# Patient Record
Sex: Female | Born: 1935 | Race: White | Hispanic: No
Health system: Northeastern US, Academic
[De-identification: ages and names within clinical notes are randomized; demographics above are authoritative.]

## PROBLEM LIST (undated history)

## (undated) DIAGNOSIS — K580 Irritable bowel syndrome with diarrhea: Secondary | ICD-10-CM

## (undated) DIAGNOSIS — M1990 Unspecified osteoarthritis, unspecified site: Secondary | ICD-10-CM

## (undated) DIAGNOSIS — I491 Atrial premature depolarization: Secondary | ICD-10-CM

## (undated) HISTORY — PX: BREAST SURGERY: SHX581

## (undated) HISTORY — PX: INCISION AND DRAINAGE: SHX5863

## (undated) HISTORY — PX: CATARACT EXTRACTION: SUR2

## (undated) HISTORY — DX: Irritable bowel syndrome with diarrhea: K58.0

## (undated) HISTORY — PX: BUNIONECTOMY: SHX129

## (undated) HISTORY — PX: OVARIAN CYST REMOVAL: SHX89

## (undated) HISTORY — PX: CARPAL TUNNEL RELEASE: SHX101

## (undated) HISTORY — PX: ABDOMINAL HYSTERECTOMY: SHX81

## (undated) HISTORY — PX: BLADDER REPAIR: SHX76

---

## 2005-12-12 ENCOUNTER — Ambulatory Visit: Payer: Self-pay | Admitting: Family Medicine

## 2006-01-02 ENCOUNTER — Ambulatory Visit: Payer: Self-pay | Admitting: Family Medicine

## 2006-02-20 ENCOUNTER — Ambulatory Visit: Payer: Self-pay | Admitting: Family Medicine

## 2006-05-09 ENCOUNTER — Ambulatory Visit: Payer: Self-pay | Admitting: Family Medicine

## 2006-05-17 ENCOUNTER — Ambulatory Visit: Payer: Self-pay | Admitting: Family Medicine

## 2006-07-25 DIAGNOSIS — M899 Disorder of bone, unspecified: Secondary | ICD-10-CM | POA: Insufficient documentation

## 2006-07-25 DIAGNOSIS — M949 Disorder of cartilage, unspecified: Secondary | ICD-10-CM

## 2006-07-25 DIAGNOSIS — R002 Palpitations: Secondary | ICD-10-CM | POA: Insufficient documentation

## 2006-07-25 DIAGNOSIS — F411 Generalized anxiety disorder: Secondary | ICD-10-CM | POA: Insufficient documentation

## 2006-07-25 DIAGNOSIS — Z8659 Personal history of other mental and behavioral disorders: Secondary | ICD-10-CM | POA: Insufficient documentation

## 2006-07-25 DIAGNOSIS — F339 Major depressive disorder, recurrent, unspecified: Secondary | ICD-10-CM | POA: Insufficient documentation

## 2006-08-10 ENCOUNTER — Ambulatory Visit: Payer: Self-pay | Admitting: Family Medicine

## 2006-10-03 ENCOUNTER — Ambulatory Visit: Payer: Self-pay | Admitting: Internal Medicine

## 2006-10-03 ENCOUNTER — Encounter: Payer: Self-pay | Admitting: Family Medicine

## 2006-10-11 ENCOUNTER — Encounter: Payer: Self-pay | Admitting: Family Medicine

## 2006-10-30 ENCOUNTER — Ambulatory Visit: Payer: Self-pay | Admitting: Internal Medicine

## 2006-10-30 ENCOUNTER — Ambulatory Visit: Payer: Self-pay

## 2006-10-30 ENCOUNTER — Encounter: Payer: Self-pay | Admitting: Internal Medicine

## 2006-10-30 ENCOUNTER — Encounter: Payer: Self-pay | Admitting: Family Medicine

## 2006-10-30 LAB — CONVERTED CEMR LAB
ALT: 20 units/L (ref 0–40)
Alkaline Phosphatase: 60 units/L (ref 39–117)
CO2: 32 meq/L (ref 19–32)
Chloride: 109 meq/L (ref 96–112)
Chol/HDL Ratio, serum: 2.9
Cholesterol: 225 mg/dL (ref 0–200)
Glucose, Bld: 89 mg/dL (ref 70–99)
Potassium: 4.5 meq/L (ref 3.5–5.1)
Sodium: 144 meq/L (ref 135–145)
Triglyceride fasting, serum: 105 mg/dL (ref 0–149)
VLDL: 21 mg/dL (ref 0–40)

## 2007-02-12 ENCOUNTER — Ambulatory Visit: Payer: Self-pay | Admitting: Family Medicine

## 2007-02-13 DIAGNOSIS — G5600 Carpal tunnel syndrome, unspecified upper limb: Secondary | ICD-10-CM | POA: Insufficient documentation

## 2007-02-19 ENCOUNTER — Telehealth: Payer: Self-pay | Admitting: Family Medicine

## 2007-05-18 ENCOUNTER — Ambulatory Visit: Payer: Self-pay

## 2007-05-30 ENCOUNTER — Ambulatory Visit: Payer: Self-pay | Admitting: Internal Medicine

## 2007-06-07 ENCOUNTER — Ambulatory Visit: Payer: Self-pay | Admitting: Internal Medicine

## 2007-06-07 LAB — CONVERTED CEMR LAB
Albumin: 3.9 g/dL (ref 3.5–5.2)
Bilirubin, Direct: 0.1 mg/dL (ref 0.0–0.3)
Chloride: 105 meq/L (ref 96–112)
Cholesterol: 213 mg/dL (ref 0–200)
Direct LDL: 111.5 mg/dL
GFR calc Af Amer: 79 mL/min
GFR calc non Af Amer: 65 mL/min
Glucose, Bld: 96 mg/dL (ref 70–99)
Potassium: 4.3 meq/L (ref 3.5–5.1)
Sodium: 139 meq/L (ref 135–145)
Total CHOL/HDL Ratio: 2.3
Triglycerides: 63 mg/dL (ref 0–149)
VLDL: 13 mg/dL (ref 0–40)

## 2008-03-12 ENCOUNTER — Ambulatory Visit: Payer: Self-pay | Admitting: Family Medicine

## 2008-03-12 ENCOUNTER — Encounter: Admission: RE | Admit: 2008-03-12 | Discharge: 2008-03-12 | Payer: Self-pay | Admitting: Family Medicine

## 2008-03-12 DIAGNOSIS — M25559 Pain in unspecified hip: Secondary | ICD-10-CM | POA: Insufficient documentation

## 2008-06-12 ENCOUNTER — Ambulatory Visit: Payer: Self-pay | Admitting: Family Medicine

## 2008-06-12 DIAGNOSIS — M545 Low back pain, unspecified: Secondary | ICD-10-CM | POA: Insufficient documentation

## 2008-06-26 ENCOUNTER — Encounter: Payer: Self-pay | Admitting: Family Medicine

## 2008-07-31 ENCOUNTER — Encounter: Payer: Self-pay | Admitting: Family Medicine

## 2008-09-04 ENCOUNTER — Encounter: Payer: Self-pay | Admitting: Family Medicine

## 2008-12-04 ENCOUNTER — Ambulatory Visit: Payer: Self-pay | Admitting: Internal Medicine

## 2008-12-04 ENCOUNTER — Encounter: Payer: Self-pay | Admitting: Internal Medicine

## 2008-12-04 DIAGNOSIS — R519 Headache, unspecified: Secondary | ICD-10-CM | POA: Insufficient documentation

## 2008-12-04 DIAGNOSIS — R51 Headache: Secondary | ICD-10-CM | POA: Insufficient documentation

## 2008-12-04 DIAGNOSIS — E785 Hyperlipidemia, unspecified: Secondary | ICD-10-CM | POA: Insufficient documentation

## 2009-03-19 ENCOUNTER — Ambulatory Visit: Payer: Self-pay | Admitting: Family Medicine

## 2009-03-19 DIAGNOSIS — H9190 Unspecified hearing loss, unspecified ear: Secondary | ICD-10-CM | POA: Insufficient documentation

## 2009-03-19 DIAGNOSIS — L255 Unspecified contact dermatitis due to plants, except food: Secondary | ICD-10-CM | POA: Insufficient documentation

## 2009-03-20 ENCOUNTER — Telehealth (INDEPENDENT_AMBULATORY_CARE_PROVIDER_SITE_OTHER): Payer: Self-pay | Admitting: *Deleted

## 2009-03-23 ENCOUNTER — Telehealth: Payer: Self-pay | Admitting: Family Medicine

## 2009-03-27 ENCOUNTER — Encounter: Payer: Self-pay | Admitting: Family Medicine

## 2009-04-08 ENCOUNTER — Encounter: Payer: Self-pay | Admitting: Family Medicine

## 2009-04-16 ENCOUNTER — Encounter: Payer: Self-pay | Admitting: Family Medicine

## 2009-05-13 ENCOUNTER — Encounter: Payer: Self-pay | Admitting: Family Medicine

## 2009-08-27 ENCOUNTER — Ambulatory Visit: Payer: Self-pay | Admitting: Family Medicine

## 2009-08-27 DIAGNOSIS — J309 Allergic rhinitis, unspecified: Secondary | ICD-10-CM | POA: Insufficient documentation

## 2009-08-28 ENCOUNTER — Telehealth: Payer: Self-pay | Admitting: Family Medicine

## 2009-09-17 ENCOUNTER — Encounter (INDEPENDENT_AMBULATORY_CARE_PROVIDER_SITE_OTHER): Payer: Self-pay | Admitting: *Deleted

## 2009-10-21 ENCOUNTER — Ambulatory Visit: Payer: Self-pay | Admitting: Family Medicine

## 2009-11-10 ENCOUNTER — Ambulatory Visit: Payer: Self-pay | Admitting: Family Medicine

## 2009-11-10 DIAGNOSIS — H6980 Other specified disorders of Eustachian tube, unspecified ear: Secondary | ICD-10-CM | POA: Insufficient documentation

## 2009-11-10 DIAGNOSIS — D239 Other benign neoplasm of skin, unspecified: Secondary | ICD-10-CM | POA: Insufficient documentation

## 2009-11-10 DIAGNOSIS — H6990 Unspecified Eustachian tube disorder, unspecified ear: Secondary | ICD-10-CM | POA: Insufficient documentation

## 2009-11-13 ENCOUNTER — Encounter: Payer: Self-pay | Admitting: Family Medicine

## 2009-11-25 ENCOUNTER — Ambulatory Visit: Payer: Self-pay | Admitting: Internal Medicine

## 2009-12-18 ENCOUNTER — Encounter: Payer: Self-pay | Admitting: Internal Medicine

## 2009-12-22 ENCOUNTER — Telehealth: Payer: Self-pay | Admitting: Internal Medicine

## 2009-12-23 LAB — CONVERTED CEMR LAB
Albumin: 4.2 g/dL (ref 3.5–5.2)
Alkaline Phosphatase: 67 units/L (ref 39–117)
BUN: 24 mg/dL — ABNORMAL HIGH (ref 6–23)
CO2: 27 meq/L (ref 19–32)
Chloride: 106 meq/L (ref 96–112)
Creatinine, Ser: 0.86 mg/dL (ref 0.40–1.20)
HCT: 34.7 % — ABNORMAL LOW (ref 36.0–46.0)
Hemoglobin: 11.1 g/dL — ABNORMAL LOW (ref 12.0–15.0)
Indirect Bilirubin: 0.3 mg/dL (ref 0.0–0.9)
LDL Cholesterol: 111 mg/dL — ABNORMAL HIGH (ref 0–99)
Potassium: 4.5 meq/L (ref 3.5–5.3)
RBC: 3.19 M/uL — ABNORMAL LOW (ref 3.87–5.11)
Total Bilirubin: 0.4 mg/dL (ref 0.3–1.2)
Total Protein: 6.8 g/dL (ref 6.0–8.3)
Triglycerides: 89 mg/dL (ref <150)
VLDL: 18 mg/dL (ref 0–40)
WBC: 3.6 10*3/uL — ABNORMAL LOW (ref 4.0–10.5)

## 2009-12-25 ENCOUNTER — Ambulatory Visit: Payer: Self-pay | Admitting: Family Medicine

## 2009-12-25 DIAGNOSIS — D61818 Other pancytopenia: Secondary | ICD-10-CM | POA: Insufficient documentation

## 2009-12-28 ENCOUNTER — Encounter: Payer: Self-pay | Admitting: Family Medicine

## 2009-12-29 LAB — CONVERTED CEMR LAB
Hemoglobin: 11.6 g/dL — ABNORMAL LOW (ref 12.0–15.0)
Lymphs Abs: 2.5 10*3/uL (ref 0.7–4.0)
MCHC: 33.2 g/dL (ref 30.0–36.0)
MCV: 105.4 fL — ABNORMAL HIGH (ref 78.0–100.0)
Monocytes Absolute: 0.3 10*3/uL (ref 0.1–1.0)
Monocytes Relative: 9 % (ref 3–12)
Neutro Abs: 0.9 10*3/uL — ABNORMAL LOW (ref 1.7–7.7)
Neutrophils Relative %: 24 % — ABNORMAL LOW (ref 43–77)
RBC: 3.31 M/uL — ABNORMAL LOW (ref 3.87–5.11)
WBC: 3.9 10*3/uL — ABNORMAL LOW (ref 4.0–10.5)

## 2009-12-30 ENCOUNTER — Encounter: Payer: Self-pay | Admitting: Family Medicine

## 2010-11-05 ENCOUNTER — Ambulatory Visit: Admit: 2010-11-05 | Payer: Self-pay | Admitting: Internal Medicine

## 2010-11-16 NOTE — Letter (Signed)
Summary: [facility]  [facility]   Imported By: Lanelle Bal 12/02/2009 09:29:57  _____________________________________________________________________  External Attachment:    Type:   Image     Comment:   External Document

## 2010-11-16 NOTE — Assessment & Plan Note (Signed)
Summary: Dog bite x last night rm 1   Vital Signs:  Patient Profile:   77 Years Old Female CC:      Dog Bite x last night Height:     63 inches Weight:      118 pounds O2 Sat:      100 % O2 treatment:    Room Air Temp:     97.2 degrees F oral Pulse rate:   65 / minute Pulse rhythm:   regular Resp:     16 per minute BP sitting:   129 / 60  (right arm) Cuff size:   regular  Vitals Entered By: Areta Haber CMA (October 21, 2009 12:38 PM)                  Prior Medication List:  ASPIR-LOW 81 MG TBEC (ASPIRIN) 1 tab by mouth daily VAGIFEM 25 MCG TABS (ESTRADIOL) 2 x awk * TUMS  ZITHROMAX Z-PAK 250 MG TABS (AZITHROMYCIN) Take as directed.   Current Allergies (reviewed today): ! PCN ! NEO-SYNEPHRINE (PHENYLEPHRINE HCL (PRESSORS))     History of Present Illness Chief Complaint: Dog Bite x last night History of Present Illness: Patient reports being bitten by her pet dog last night. she was trying to get a paper bag w/chocolate. The animal has had its shots and was a puppy. Unsure of last tetnus status.  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 ZITHROMAX Z-PAK 250 MG  TABS (AZITHROMYCIN) Use as directed BACTROBAN 2 % OINT (MUPIROCIN) apply to the wound 3x aday for 7-10 days  REVIEW OF SYSTEMS Constitutional Symptoms      Denies fever, chills, night sweats, weight loss, weight gain,  and fatigue.  Eyes       Denies change in vision, eye pain, eye discharge, glasses, contact lenses, and eye surgery. Ear/Nose/Throat/Mouth       Denies hearing loss/aids, change in hearing, ear pain, ear discharge, dizziness, frequent runny nose, frequent nose bleeds, sinus problems, sore throat, hoarseness, and tooth pain or bleeding.  Respiratory       Denies dry cough, productive cough, wheezing, shortness of breath, asthma, bronchitis, and emphysema/COPD.  Cardiovascular       Denies murmurs, chest pain, and tires easily with exhertion.    Gastrointestinal       Denies stomach pain, nausea/vomiting, diarrhea, constipation, blood in bowel movements, and indigestion. Genitourniary       Denies painful urination, kidney stones, and loss of urinary control. Neurological       Denies paralysis, seizures, and fainting/blackouts. Musculoskeletal       Denies muscle pain, joint pain, joint stiffness, decreased range of motion, redness, swelling, muscle weakness, and gout.  Skin       Denies bruising, unusual mles/lumps or sores, and hair/skin or nail changes.  Psych       Denies mood changes, temper/anger issues, anxiety/stress, speech problems, depression, and sleep problems. Other Comments: Pt state her dog bit her last night on R ring finger.   Past History:  Past Medical History: Last updated: 12/04/2008 1)  hx of agoraphobia and panic attacks 2) PACs-- caffeine - induced (Dr Gala Romney-- f/u stress test and echo)     --Echo 1/08 EF 65% trivial AI 3) Borderline hyperlipidemia 4) GYN- Dr Theophilus Bones (mammo and DXA ??)  Past Surgical History: Last updated: 10/11/2006 bladder surgery  Hysterectomy & BSO ovarian cyst removed  Family History: Last updated: 12/04/2008 No FHX CAD mother is alive in her 60s Father died in his 62s due to lung cancer  brother is alive and well in 64s  Social History: Last updated: 07/25/2006 Retired psychiatric NP.  Quit smoking in 1955.  Widowed x  1, married now to Cletis Athens- who is working in [place].  2 kids.  Son in [place].  Cares for her 2 yo grandson-- from her husband's family.  Exercises regularly.  Family History: Reviewed history from 12/04/2008 and no changes required. No FHX CAD mother is alive in her 33s Father died in his 31s due to lung cancer  brother is alive and well in 67s  Social History: Reviewed history from 07/25/2006 and no changes required. Retired psychiatric NP.  Quit smoking in 1955.  Widowed x 1, married now to Cletis Athens- who is working in [place].  2 kids.  Son in [place].  Cares for her 2 yo grandson-- from her husband's family.  Exercises regularly. Physical Exam General appearance: well developed, well nourished, mild distress Head: normocephalic, atraumatic Extremities: bite on R ring fingerw/  Neurological: grossly intact and non-focal Skin: no obvious rashes or lesions MSE: oriented to time, place, and person Assessment New Problems: DOG BITE (ICD-E906.0)  dog bite  Plan New Medications/Changes: BACTROBAN 2 % OINT (MUPIROCIN) apply to the wound 3x aday for 7-10 days  #1 tube x 0, 10/21/2009, Hassan Rowan MD ZITHROMAX Z-PAK 250 MG  TABS (AZITHROMYCIN) Use as directed  #1 x 0, 10/21/2009, Hassan Rowan MD  New Orders: New Patient Level III [99203] TD Toxoids IM 7 YR + [90714] Admin 1st Vaccine [90471] Admin 1st Vaccine [facility]) [phone] Planning Comments:   will give tetnus  Follow Up: Follow up on an as needed basis, Follow up with Primary Physician  The patient and/or caregiver has been counseled thoroughly with regard to medications prescribed including dosage, schedule, interactions, rationale for use, and possible side effects and they verbalize understanding.  Diagnoses and expected course of recovery discussed and will return if not improved as expected or if the condition worsens. Patient and/or caregiver verbalized understanding.  Prescriptions: BACTROBAN 2 % OINT (MUPIROCIN) apply to the  wound 3x aday for 7-10 days  #1 tube x 0   Entered and Authorized by:   Hassan Rowan MD   Signed by:   Hassan Rowan MD on 10/21/2009   Method used:   Printed then faxed to ...       [facility]  [place] [phone][facility] ([facility]       [address] [place], [place]  [address]       Ph: [phone] or [phone]       Fax: [phone]   RxID:   [id number] ZITHROMAX Z-PAK 250 MG  TABS (AZITHROMYCIN) Use as directed  #1 x 0   Entered and Authorized by:   Hassan Rowan MD   Signed by:   Hassan Rowan MD on 10/21/2009   Method used:   Printed then faxed to ...       [facility]  [address] [phone][facility] (retail)       [phone]  [address] [place], [place]  [address]       Ph: [phone] or [phone]       Fax: [phone]   RxID:   [id number]   Patient Instructions: 1)  may soak finger in epsom salt solution 2)  Please schedule a follow-up appointment as needed. 3)  Please schedule an appointment with your primary doctor in : 4)  Take your antibiotic as prescribed until ALL of it is gone, but stop if you develop a rash or swelling and contact our office as soon as possible.   Pending Immunizations Td Booster  Immunizations given and recorded during this visit TD BOOSTER Td  UVO:[id number] Pasteur  LotNo:[id number]  ExpDt:11/25/2010  IM  right deltoid  by Areta Haber CMA  VIS:09/04/07 version given October 21, 2009.      Tetanus/Td Vaccine    Vaccine Type: Td    Site: right deltoid    Mfr: Sanofi Pasteur    Dose: 0.5 ml    Route: IM    Given by: Areta Haber CMA    Exp. Date: 11/25/2010    Lot #: [id number]    VIS given: 09/04/07 version given October 21, 2009.

## 2010-11-16 NOTE — Miscellaneous (Signed)
Summary: TETANUS INFO  TETANUS INFO   Imported By: Junius Finner 10/21/2009 17:00:00  _____________________________________________________________________  External Attachment:    Type:   Image     Comment:   External Document

## 2010-11-16 NOTE — Progress Notes (Signed)
Summary: Lab results  Phone Note Call from Patient Call back at [phone]   Caller: Patient Summary of Call: Pt calling for lab results Initial call taken by: Judie Grieve,  December 22, 2009 8:38 AM  Follow-up for Phone Call        Pt called Dr. Ovidio Kin office inquiring about labs. Pt upset about not knowing lab results. I called cards and spoke w/ Herbert Seta- she stated that our office scheduled OV before she had a chance to call Pt w/ results. I explained to Pt and she will await Heather's call. Pt much more calm before hanging up.  Follow-up by: Payton Spark CMA,  December 23, 2009 2:17 PM  Additional Follow-up for Phone Call Additional follow up Details #1::        pt is aware of lab results, copy mailed per pt request Additional Follow-up by: Meredith Staggers, RN,  December 23, 2009 2:34 PM

## 2010-11-16 NOTE — Assessment & Plan Note (Signed)
Summary: Z6X   Primary Provider:  Seymour Zavala D.O.   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 she has a history of palpitations due to PACs which are particularly caffeine sensitive. She also has a history of borderline hyperlipidemia but has a very high HDL.  She returns today for yearly followup. He is doing great. She is exercising frequently at the gym on the treadmill and elliptical trainer. Also doing weights. She denies any chest pain or shortness of breath. Very rare brief palpitations. Worse with stress. Avoiding caffeine. She has not had any syncope or presyncope.    Current Medications (verified): 1)  Aspir-Low 81 Mg Tbec (Aspirin) .Marland Kitchen.. 1 Tab By Mouth Daily 2)  Vagifem 25 Mcg Tabs (Estradiol) .... 2 X Awk 3)  Tums 4)  Lutein 6 Mg Caps (Lutein) .... Take 1 Capsule By Mouth Two Times A Day 5)  Prednisone 5 Mg/77ml Soln (Prednisone) .... Two Times A Day  Allergies (verified): 1)  ! Pcn 2)  ! Neo-Synephrine (Phenylephrine Hcl (Pressors))  Past History:  Past Medical History: Last updated: 12/04/2008 1) hx of agoraphobia and panic attacks 2) PACs-- caffeine - induced (Kristin Zavala-- f/u stress test and echo)     --Echo 1/08 EF 65% trivial AI 3) Borderline hyperlipidemia 4) GYN- Kristin Zavala (mammo and DXA ??)  Review of Systems       As per HPI and past medical history; otherwise all systems negative.   Vital Signs:  Patient profile:   77 year old female Weight:      118 pounds Pulse rate:   66 / minute BP sitting:   120 / 68  Vitals Entered By: Kristin Staggers, RN (November 25, 2009 9:51 AM)  Physical Exam  General:  WRU:EAVW. well appearing. no resp difficulty.  HEENT: normal Neck: supple. no JVD. Carotids 2+ bilat; no bruits. No lymphadenopathy or thryomegaly appreciated. Cor: PMI nondisplaced. Regular rate & rhythm. No rubs, gallops, murmur. Lungs: clear Abdomen: soft, nontender,  nondistended.  Good bowel sounds. Extremities: no cyanosis, clubbing, rash, edema Neuro: alert & orientedx3, cranial nerves grossly intact. moves all 4 extremities w/o difficulty. affect pleasant    Impression & Recommendations:  Problem # 1:  PALPITATIONS (ICD-785.1) PACs well controlled. No further work-up at this time.   Problem # 2:  HYPERLIPIDEMIA-MIXED (ICD-272.4) Recheck lipids today and will forward to Kristin. Kristin Zavala as well.  Problem # 3:  HEALTH SCREENING (ICD-V70.0) Check labs including CBC, CMET, Lipids, Vitamin D.   Other Orders: EKG w/ Interpretation (93000)  Patient Instructions: 1)  Labs at [facility] 2)  Follow up in 1 year

## 2010-11-16 NOTE — Assessment & Plan Note (Signed)
Summary: eustachian tube dysfunction   Vital Signs:  Patient profile:   77 year old female Height:      63 inches Weight:      118 pounds BMI:     20.98 O2 Sat:      100 % on Room air Temp:     97.9 degrees F oral Pulse rate:   61 / minute BP sitting:   97 / 53  (left arm) Cuff size:   regular  Vitals Entered By: Payton Spark CMA (November 10, 2009 11:28 AM)  O2 Flow:  Room air CC: R ear ache. Feels fluid in ear.    Primary Care Provider:  Seymour Bars D.O.  CC:  R ear ache. Feels fluid in ear. Marland Kitchen  History of Present Illness: 3 days of crackling sensation in the R ear.  No fluid running out of the ear.  No dizziness, hearing loss, fevers or chills.  She is a bit congested. No stabbing pain.  She is not using anything RX or OTC for her R ear.    Current Medications (verified): 1)  Aspir-Low 81 Mg Tbec (Aspirin) .Marland Kitchen.. 1 Tab By Mouth Daily 2)  Vagifem 25 Mcg Tabs (Estradiol) .... 2 X Awk 3)  Tums  Allergies (verified): 1)  ! Pcn 2)  ! Neo-Synephrine (Phenylephrine Hcl (Pressors))  Past History:  Past Medical History: Reviewed history from 12/04/2008 and no changes required. 1) hx of agoraphobia and panic attacks 2) PACs-- caffeine - induced (Dr Gala Romney-- f/u stress test and echo)     --Echo 1/08 EF 65% trivial AI 3) Borderline hyperlipidemia 4) GYN- Dr Theophilus Bones (mammo and DXA ??)  Past Surgical History: bladder surgery  Hysterectomy & BSO ovarian cyst removed L cataract surgery 10-2009  Social History: Reviewed history from 07/25/2006 and no changes required. Retired psychiatric NP.  Quit smoking in 1955.  Widowed x 1, married now to Cletis Athens- who is working in [place].  2 kids.  Son in [place].  Cares for her 2 yo grandson-- from her husband's family.  Exercises regularly.  Review of Systems      See HPI  Physical Exam  General:  alert, well-developed, well-nourished, and well-hydrated.   Head:  normocephalic and atraumatic.   Eyes:  conjunctiva clear Ears:   EACs patent; TMs translucent and gray with good cone of light and bony landmarks.  Nose:  scant clear rhinorrhea Mouth:  throat midly injected Neck:  no masses.   Lungs:  Normal respiratory effort, chest expands symmetrically. Lungs are clear to auscultation, no crackles or wheezes. Heart:  Normal rate and regular rhythm. S1 and S2 normal without gallop, murmur, click, rub or other extra sounds. Skin:  color normal.  scaley pink lesion over dorsum of left hand Cervical Nodes:  No lymphadenopathy noted   Impression & Recommendations:  Problem # 1:  DYSFUNCTION OF EUSTACHIAN TUBE (ICD-381.81) Likely cause of her R ear symptoms with a normal ear exam. Treat with Claritin once daily for the next wk.  Problem # 2:  NEVUS, ATYPICAL (ICD-216.9) Will get her back in with Dr Arelia Longest to eval the pink scaley lesion on dorsum of L hand, present for months. Orders: Dermatology Referral (Derma)  Complete Medication List: 1)  Aspir-low 81 Mg Tbec (Aspirin) .Marland Kitchen.. 1 tab by mouth daily 2)  Vagifem 25 Mcg Tabs (Estradiol) .... 2 x awk 3)  Tums   Patient Instructions: 1)  Will get you in with Dr Arelia Longest for lesion on hand. 2)  Use  OTC Claritin once daily for eustachian tube dysfunction. 3)  Labs due -- seeing Dr Milas Kocher.

## 2010-11-16 NOTE — Assessment & Plan Note (Signed)
Summary: pancytopenia   Vital Signs:  Patient profile:   77 year old female Height:      63 inches Weight:      112 pounds BMI:     19.91 O2 Sat:      100 % on Room air Temp:     97.5 degrees F oral Pulse rate:   79 / minute BP sitting:   112 / 69  (left arm) Cuff size:   regular  Vitals Entered By: Payton Spark CMA (December 25, 2009 10:10 AM)  O2 Flow:  Room air CC: F/U labs   Primary Care Provider:  Seymour Bars D.O.  CC:  F/U labs.  History of Present Illness: 77 yo WF presents for f/u labs drawn by cardiology.  She is currently having diarrhea x 48 hrs.  She has lost 6 lbs but has not been trying to.  She has not had any hot flashes or nightsweats.  Her energy level has been fine.  She has not been on any immunosuppressants other than prednisone eye drops that she used between Jan and Feb.      Allergies: 1)  ! Pcn 2)  ! Neo-Synephrine (Phenylephrine Hcl (Pressors))  Review of Systems General:  Complains of loss of appetite, malaise, and weight loss; denies chills, fatigue, fever, sweats, and weakness. ENT:  Denies sore throat. CV:  Denies chest pain or discomfort and swelling of feet. Resp:  Denies cough and shortness of breath. GI:  Complains of diarrhea; denies abdominal pain, nausea, and vomiting. MS:  denies bone pain.  Physical Exam  General:  alert, well-developed, well-nourished, and well-hydrated.  thin appearing, slightly pale.  here with husband Head:  normocephalic and atraumatic.   Eyes:  sclera non icteric Mouth:  good dentition and pharynx pink and moist.   Neck:  no masses.   Lungs:  Normal respiratory effort, chest expands symmetrically. Lungs are clear to auscultation, no crackles or wheezes. Heart:  Normal rate and regular rhythm. S1 and S2 normal without gallop, murmur, click, rub or other extra sounds. Abdomen:  Bowel sounds positive,abdomen soft and non-tender without masses, organomegaly Extremities:  no LE edema Skin:  no jaundice,  slightly pale no bruising Cervical Nodes:  No lymphadenopathy noted Psych:  good eye contact, not anxious appearing, and not depressed appearing.      Impression & Recommendations:  Problem # 1:  PANCYTOPENIA (ICD-284.1) Assessment New Reviewed CBC from cardiology which shows pancytopenia, mild.  Exam findings normal other than some pallor and weight loss but she's also had 48 hrs of diarrhea. Will repeat CBC with diff and a peripheral smear on Monday (after GI bug resolves).  F/U results.  If still abnormal, will refer to Dr Mathis Bud to look for a myelosuppressive malignancy. Orders: T-CBC w/Diff ([id number]) T-Pathologist Smear Review (10080)  Complete Medication List: 1)  Aspir-low 81 Mg Tbec (Aspirin) .Marland Kitchen.. 1 tab by mouth daily 2)  Vagifem 25 Mcg Tabs (Estradiol) .... 2 x awk 3)  Tums  4)  Lutein 6 Mg Caps (Lutein) .... Take 1 capsule by mouth two times a day 5)  Prednisone 5 Mg/8ml Soln (Prednisone) .... Two times a day 6)  Vitamin D 50,000 Iu Capsules  .Marland Kitchen.. 1 capsule by mouth once a wk  Patient Instructions: 1)  Have labs drawn downstairs on Monday. 2)  Will call you w/ results on Tuesday. 3)  Schedule with Dr Abbe Amsterdam if still abnormal. 4)  Hydrate and rest this weekend. 5)  Use Immodium AD  as needed for diarrhea. Prescriptions: VITAMIN D 50,000 IU CAPSULES 1 capsule by mouth once a wk  #4 x 3   Entered and Authorized by:   Seymour Bars DO   Signed by:   Seymour Bars DO on 12/25/2009   Method used:   Printed then faxed to ...       [facility]  [place] [phone]* (retail)       [address] [place], [place]  [address]       Ph: [phone] or [phone]       Fax: [phone]   RxID:   [id number]

## 2010-12-08 ENCOUNTER — Encounter: Payer: Self-pay | Admitting: Internal Medicine

## 2010-12-08 ENCOUNTER — Ambulatory Visit (INDEPENDENT_AMBULATORY_CARE_PROVIDER_SITE_OTHER): Payer: Medicare Other | Admitting: Internal Medicine

## 2010-12-08 DIAGNOSIS — I4949 Other premature depolarization: Secondary | ICD-10-CM

## 2010-12-08 DIAGNOSIS — E785 Hyperlipidemia, unspecified: Secondary | ICD-10-CM

## 2010-12-09 ENCOUNTER — Ambulatory Visit: Payer: Self-pay | Admitting: Internal Medicine

## 2010-12-13 ENCOUNTER — Ambulatory Visit: Payer: Self-pay | Admitting: Internal Medicine

## 2010-12-14 NOTE — Assessment & Plan Note (Signed)
Summary: rov/chest pain/car PER PT CALL-[facility]J PT RS APPT/MT CONFIRMED APP...   Visit Type:  Follow-up Primary Provider:  Seymour Zavala D.O.  CC:  no cardiac complaints.  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 she has a history of palpitations due to PACs which are particularly caffeine sensitive. She also has a history of borderline hyperlipidemia but has a very high HDL.  She returns today for yearly followup. He is doing great. She is exercising frequently at the gym on the treadmill and elliptical trainer. Also doing weights. Had some CP a while back while on Actonel and started on nexium and symptoms resolved. Now off Nexium with no recurrence. Still with occasional PACs. Worse with stress. Avoiding caffeine. She has not had any syncope or presyncope.  Seeing Kristin. Kristin Zavala at [facility] for mild pancytopenia. BM bx unrevealing. No bleeding.   Current Medications (verified): 1)  Aspir-Low 81 Mg Tbec (Aspirin) .Marland Kitchen.. 1 Tab By Mouth Daily 2)  Vagifem 10 Mcg Tabs (Estradiol) .... Take One Twice A Week 3)  Lutein 6 Mg Caps (Lutein) .... Take 1 Capsule By Mouth Two Times A Day 4)  Prednisone 5 Mg/76ml Soln (Prednisone) .... Two Times A Day 5)  Vitamin D-1000 Max St 1000 Unit Tabs (Cholecalciferol) .... Take One Daily 6)  Calcium 500 Mg Tabs (Calcium) .... Take One Daily 7)  Centrum Silver Ultra Womens  Tabs (Multiple Vitamins-Minerals) .... Take One Daily 8)  Valtrex 500 Mg Tabs (Valacyclovir Hcl) .... Take One Twice For Three Days Prn  Allergies (verified): 1)  ! Pcn 2)  ! Neo-Synephrine (Phenylephrine Hcl (Pressors))  Past History:  Past Medical History: 1) hx of agoraphobia and panic attacks 2) PACs-- caffeine - induced (Kristin Zavala-- f/u stress test and echo)     --Echo 1/08 EF 65% trivial AI 3) Borderline hyperlipidemia 4) GYN- Kristin Zavala (mammo and DXA ??) 5) Pancytopenia - followed by Kristin. Kristin Zavala @ [facility]  Family  History: Reviewed history from 12/04/2008 and no changes required. No FHX CAD mother is alive in her 78s Father died in his 22s due to lung cancer  brother is alive and well in 65s  Social History: Reviewed history from 07/25/2006 and no changes required. Retired psychiatric NP.  Quit smoking in 1955.  Widowed x 1, married now to Kristin Zavala- who is working in [place].  2 kids.  Son in [place].  Cares for her 2 yo grandson-- from her husband's family.  Exercises regularly.  Review of Systems       As per HPI and past medical history; otherwise all systems negative.   Vital Signs:  Patient profile:   77 year old female Height:      63 inches Weight:      118 pounds BMI:     20.98 Pulse rate:   68 / minute Pulse rhythm:   regular BP sitting:   102 / 62  (right arm)  Vitals Entered By: Kristin Zavala, Kristin Zavala (December 08, 2010 11:23 AM)  Physical Exam  General:  thin. well appearing. no resp difficulty.  HEENT: normal Neck: supple. no JVD. Carotids 2+ bilat; no bruits. No lymphadenopathy or thryomegaly appreciated. Cor: PMI nondisplaced. Regular rate & rhythm. No rubs, gallops, murmur. Lungs: clear Abdomen: soft, nontender, nondistended.  Good bowel sounds. Extremities: no cyanosis, clubbing, rash, edema Neuro: alert & orientedx3, cranial nerves grossly intact. moves all 4 extremities w/o difficulty. affect pleasant   Impression & Recommendations:  Problem # 1:  PALPITATIONS (ICD-785.1) PACs well controlled. No further work-up at this time. Can stop ASA, particularly given pancytopenia.  Problem # 2:  HYPERLIPIDEMIA-MIXED (ICD-272.4) Due for repeat lipid check will also check CRP at her request.  Problem # 3:  CHEST PAIN I agree with Kristin Zavala - suspect GERD in setting of Actonel therapy. Now resolved. No exertional symptoms. No further work-up at this time.   Other Orders: EKG w/ Interpretation (93000)  Patient Instructions: 1)  Stop Aspirin 2)  Labs at [facility], we have  given you a prescription 3)  Your physician wants you to follow-up in:  1 year.  You will receive a reminder letter in the mail two months in advance. If you don't receive a letter, please call our office to schedule the follow-up appointment.

## 2010-12-17 ENCOUNTER — Encounter: Payer: Self-pay | Admitting: Internal Medicine

## 2010-12-20 LAB — CONVERTED CEMR LAB
HCT: 35.7 % — ABNORMAL LOW (ref 36.0–46.0)
Hemoglobin: 11.6 g/dL — ABNORMAL LOW (ref 12.0–15.0)
MCV: 105 fL — ABNORMAL HIGH (ref 78.0–100.0)
WBC: 4.1 10*3/uL (ref 4.0–10.5)

## 2010-12-21 ENCOUNTER — Encounter: Payer: Self-pay | Admitting: Internal Medicine

## 2010-12-22 ENCOUNTER — Telehealth: Payer: Self-pay | Admitting: Internal Medicine

## 2010-12-23 ENCOUNTER — Telehealth: Payer: Self-pay | Admitting: Internal Medicine

## 2010-12-28 LAB — CONVERTED CEMR LAB
Albumin: 4.3 g/dL (ref 3.5–5.2)
Alkaline Phosphatase: 49 units/L (ref 39–117)
BUN: 30 mg/dL — ABNORMAL HIGH (ref 6–23)
Bilirubin, Direct: 0.1 mg/dL (ref 0.0–0.3)
CO2: 28 meq/L (ref 19–32)
Calcium: 9 mg/dL (ref 8.4–10.5)
Chloride: 106 meq/L (ref 96–112)
Creatinine, Ser: 0.94 mg/dL (ref 0.40–1.20)
Glucose, Bld: 84 mg/dL (ref 70–99)
Indirect Bilirubin: 0.4 mg/dL (ref 0.0–0.9)
LDL Cholesterol: 113 mg/dL — ABNORMAL HIGH (ref 0–99)
Total Bilirubin: 0.5 mg/dL (ref 0.3–1.2)

## 2010-12-28 NOTE — Progress Notes (Signed)
Summary: Pt returning call  Phone Note Call from Patient Call back at [phone]   Caller: Patient Summary of Call: Pt returning call Initial call taken by: Judie Grieve,  December 23, 2010 10:33 AM  Follow-up for Phone Call        Pt. wanted to know her fasting bl. work. Dr. Gala Romney hasn't reviewed yet. Copy sent in mail per patient's request. Ellender Hose RN  December 23, 2010 10:41 AM  Follow-up by: Whitney Maeola Sarah RN,  December 23, 2010 10:41 AM

## 2010-12-28 NOTE — Progress Notes (Signed)
Summary: pt returning call  Phone Note Call from Patient Call back at [phone]   Caller: Patient Summary of Call: Pt returning call Initial call taken by: Judie Grieve,  December 22, 2010 1:43 PM  Follow-up for Phone Call        LVM that we would call her once blood work has been signed & reviewed. Whitney Maeola Sarah RN  December 22, 2010 3:22 PM  Follow-up by: Whitney Maeola Sarah RN,  December 22, 2010 3:22 PM

## 2011-03-01 NOTE — Assessment & Plan Note (Signed)
[facility]                            CARDIOLOGY OFFICE NOTE   LAILA, MYHRE                     MRN:          [id number]  DATE:05/30/2007                            DOB:          05/24/1931    PRIMARY CARE PHYSICIAN:  Seymour Bars, D.O.   PATIENT IDENTIFICATION:  Ms. Dragos is a delightful 77 year old woman  who is marred to Desmond Lope and presents for routine followup.  She has  a history of PACs and borderline hyperlipidemia with a high HDL.  She is  doing quite well.  She is active.  She goes to the gym 4 times a week  without any chest pain or shortness of breath.  She did have a stress  test recently which showed good exercise tolerance and no ST-T wave  changes with exercise.  She does continue to feel occasional extra beats  but no sustained palpitations.   She has been hesitant about starting low dose aspirin due to risk of  bruising.   CURRENT MEDICATIONS:  1. Lexapro 5 mg a day.  2. Ocuvite 1 tablet a day.  3. Tums 2 tablets a day.  4. Aspirin pending.  5. Erythromycin eye drops.   PHYSICAL EXAMINATION:  GENERAL:  She is well-appearing in no acute  distress. She ambulates around the clinic without any respiratory  difficulty.  VITAL SIGNS:  Blood pressure is initially 120/60, manual  recheck 135/60, heart rate is 63, weight is 122.  HEENT:  Normal.  NECK:  Supple.  There is no JVD.  Carotids are 2 plus bilaterally  without any bruits.  There is no lymphadenopathy or thyromegaly.  CARDIAC:  Regular rate and rhythm.  No murmurs, rubs, or gallops.  PMI  is nondisplaced.  LUNGS:  Clear.  ABDOMEN:  Soft, nontender, nondistended.  No hepatosplenomegaly.  No  bruits.  No masses.  Good bowel sounds.  EXTREMITIES:  Warm with no cyanosis, clubbing, or edema.  No rash.  NEUROLOGIC:  Alert and oriented x3.  Cranial nerves II-XII are intact.  She moves all 4 extremities without difficulty.   EKG shows a sinus rhythm at a rate of  63.  No significant ST-T wave  abnormalities.   ASSESSMENT:  1. Premature atrial contractions.  These are stable.  Continue p.r.n.      beta-blocker.  2. Hyperlipidemia.  Her most recently lipid panel looked quite good.      We will recheck.  She will follow up with Dr. Cathey Endow.  3. Blood pressure.  On manual check today, blood pressure is a little      elevated but she says she is usually in the 110 to 120 range      systolically and thus this should not need any further followup for      now.   DISPOSITION:  I will see her back for routine followup in 6 months.     Bevelyn Buckles. Bensimhon, MD  Electronically Signed    DRB/MedQ  DD: 05/30/2007  DT: 05/31/2007  Job #: [id number]

## 2011-03-01 NOTE — Procedures (Signed)
[facility]                              EXERCISE TREADMILL   Kristin Zavala, Kristin Zavala                     MRN:          [id number]  DATE:05/18/2007                            DOB:          [birth year]    PRIMARY CARDIOLOGIST:  Bevelyn Buckles. Bensimhon, MD   PRIMARY CARE Henryk Ursin:  Seymour Bars, D.O.   PATIENT PROFILE:  A 77 year old Caucasian female with a prior history of  PACs, who presents for an exercise treadmill test for risk  stratification.  Pre-exercise the patient denies any chest pain or  shortness of breath.  Heart rate is 75 with blood pressure 137/66.  ECG  shows sinus rhythm with an incomplete right bundle branch block and with  minimal upsloping J-point depression in II, III, aVF, and V3 through V6.   Exercise:  The patient exercised for a total of 7 minutes 25 seconds to  a maximum heart rate of 130 beats per minute, which was 89% of predicted  maximal heart rate.  She achieved 9.10 METs.  Peak blood pressure was  163/70.  She denied any chest pain and the test was stopped secondary to  oncoming dyspnea and achievement of 85% of maximum heart rate.  ECG was  without any acute ST or T changes.   Recovery:  The patient remained asymptomatic and her heart rate  recovered to 60 beats per minute by 3 minutes 50 seconds of recovery.  There were no acute ST or T changes.  Final blood pressure was 148/76.      Nicolasa Ducking, ANP  Electronically Signed      Pricilla Riffle, MD, [facility]  Electronically Signed   CB/MedQ  DD: 05/18/2007  DT: 05/19/2007  Job #: [id number]

## 2011-03-04 NOTE — Assessment & Plan Note (Signed)
[facility]                            CARDIOLOGY OFFICE NOTE   Kristin Zavala, Kristin Zavala                     MRN:          [id number]  DATE:10/03/2006                            DOB:          [birth year]    REFERRING PHYSICIAN:  Seymour Bars, D.O.   REASON FOR CONSULTATION:  PACs.   PATIENT IDENTIFICATION:  Kristin Zavala is a delightful, 77 year old woman  who is married to Kristin Zavala and presents for further evaluation of  PACs.   HISTORY:  Kristin Zavala denies any history of known coronary artery  disease. 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 She did have a workup  with this including a Holter monitor. She says that her PACs come and  go, they happen about 1-2 times a month and increase with emotional  stress. She is totally refrained from caffeine though she does eat some  chocolate. The PACs are very variable. They typically come in spurts  where she will have multiple PACs over a few minutes and then they will  resolve but this can happen multiple times throughout the day. She has  never had a persistent run of tachycardia. She denies any syncope or  presyncope, denies any chest pain, no heart failure. She goes to the gym  twice a week without any significant limitation.   REVIEW OF SYSTEMS:  Reviewed in detail. This is only significant for  some urinary incontinence status post bladder repair, also arthritis and  a history of depression and allergies. The remainder of the review of  systems is negative.   PAST MEDICAL HISTORY:  1. PACs.  2. Borderline hyperlipidemia.  3. Status post bladder repair.  4. Depression.   CURRENT MEDICATIONS:  Lexapro 5 mg a day, Tums and Ocuvite.   ALLERGIES:  PENICILLIN.   SOCIAL HISTORY:  She is married. She is a psychiatric Sales executive. She is currently not working. She denies any  tobacco use. She did smoke briefly but quit  in 1965. Occasional glass of  wine.   FAMILY HISTORY:  No history of coronary artery disease. Mother is alive  and well at 31, father died at 41 due to lung cancer. Brother is alive  and well at 33. There is a family history of longevity.   PHYSICAL EXAMINATION:  GENERAL:  She is a well-appearing woman in no  acute distress who ambulates around the clinic without any respiratory  difficulty. Blood pressure is 126/73, heart rate 65, weight is 121.  HEENT:  Sclera anicteric. EOMI. There are scattered xanthelasma. Mucous  membranes are moist, the oropharynx is clear. There is good dentition.  NECK:  Supple. No JVD. The carotids are 2+ bilaterally without any  bruits. There is no lymphadenopathy or thyromegaly.  CARDIAC:  Regular rate and rhythm with an S4, no murmurs or rubs.  LUNGS:  Clear.  ABDOMEN:  Soft, nontender, nondistended, no hepatosplenomegaly, no  bruits, no masses, good bowel sounds.  EXTREMITIES:  Warm with no cyanosis, clubbing or edema. There is good  capillary  refill. The distal pulses are strong. There is no rashes or  arthropathies.  NEUROLOGIC:  Alert and oriented x3. Cranial nerves II-XII are intact.  Moves all 4 extremities without difficulty. Affect is very pleasant.   EKG shows normal sinus rhythm at a rate of 60. There is an RSR prime.  Intervals and axis are normal.   Cholesterol shows total cholesterol of 244, triglycerides 77, HDL 89 and  LDL 140.   ASSESSMENT/PLAN:  1. PACs. These obviously are likely benign but just more of a      nuisance. I did give her a prescription for Lopressor 25 mg that      she can use on a p.r.n. basis. We will get an echocardiogram just      to make sure she has normal left ventricular structure and      function.  2. Preventive cardiology. Her LDL is a bit on the high side though      technically at goal. We are going to go ahead and repeat this and      if her LDL remains above 130, I do think it may be reasonable to       start a low dose statin. Will also proceed with a screening      treadmill test and we have asked her to start a baby aspirin 81 mg      a day. She will return to clinic in 6 months for routine followup.      I told her to call me sooner if she needed anything.     Bevelyn Buckles. Bensimhon, MD  Electronically Signed    DRB/MedQ  DD: 10/03/2006  DT: 10/04/2006  Job #: [id number]   cc:   Seymour Bars, D.O.

## 2011-12-17 ENCOUNTER — Emergency Department (INDEPENDENT_AMBULATORY_CARE_PROVIDER_SITE_OTHER)
Admission: EM | Admit: 2011-12-17 | Discharge: 2011-12-17 | Disposition: A | Discharge status: Home / Self Care | Payer: Medicare Other | Source: Home / Self Care | Attending: Family Medicine | Admitting: Family Medicine

## 2011-12-17 ENCOUNTER — Encounter: Payer: Self-pay | Admitting: Emergency Medicine

## 2011-12-17 DIAGNOSIS — J069 Acute upper respiratory infection, unspecified: Secondary | ICD-10-CM

## 2011-12-17 DIAGNOSIS — J310 Chronic rhinitis: Secondary | ICD-10-CM

## 2011-12-17 HISTORY — DX: Atrial premature depolarization: I49.1

## 2011-12-17 HISTORY — DX: Unspecified osteoarthritis, unspecified site: M19.90

## 2011-12-17 MED ORDER — BENZONATATE 200 MG PO CAPS: 200.0000 mg | ORAL_CAPSULE | Freq: Every day | ORAL | Status: AC

## 2011-12-17 MED ORDER — AZITHROMYCIN 250 MG PO TABS: ORAL_TABLET | ORAL | Status: AC

## 2011-12-17 NOTE — ED Notes (Signed)
Congestion, sinus problems, cough, hoarseness x 4 days. Only low grade fever at home.

## 2011-12-17 NOTE — Discharge Instructions (Signed)
Take plain Mucinex (guaifenesin) twice daily for cough and congestion.  Add Sudafed as needed.  Increase fluid intake, rest. May use Afrin nasal spray (or generic oxymetazoline) twice daily for about 5 days.  Also recommend using saline nasal spray several times daily and saline nasal irrigation (AYR is a common brand) Stop all antihistamines for now, and other non-prescription cough/cold preparations. Begin Azithromycin if not improving about 5 days or if persistent fever develops. Follow-up with family doctor if not improving 7 to 10 days.

## 2011-12-17 NOTE — ED Provider Notes (Signed)
History     CSN: [id number]  Arrival date & time 12/17/11  1500   First MD Initiated Contact with Patient 12/17/11 1616      Chief Complaint  Patient presents with  . Nasal Congestion      HPI Comments: Patient complains of approximately 4 day history of gradually progressive URI symptoms beginning with a mild sore throat (now resolved), followed by progressive nasal congestion.  A cough started about 2 days ago.  Complains of fatigue and initial myalgias.  Cough is now worse at night and generally non-productive during the day.  There has been no pleuritic pain, shortness of breath, or wheezes.   The history is provided by the patient.    Past Medical History  Diagnosis Date  . Osteoarthritis   . Pancytopenia   . PAC (premature atrial contraction)     Past Surgical History  Procedure Date  . Catarract   . Carpal tunnel release     Family History  Problem Relation Age of Onset  . Hypertension Mother   . Diabetes Brother     History  Substance Use Topics  . Smoking status: Never Smoker   . Smokeless tobacco: Not on file  . Alcohol Use: Yes    OB History    Grav Para Term Preterm Abortions TAB SAB Ect Mult Living                  Review of Systems No sore throat + cough + hoarse No pleuritic pain No wheezing + nasal congestion + post-nasal drainage ? sinus pain/pressure No itchy/red eyes ? Earache; ears feel clogged No hemoptysis No SOB + low grade fever, + chills No nausea No vomiting No abdominal pain No diarrhea No urinary symptoms No skin rashes + fatigue + myalgias No headache Used OTC meds without relief  Allergies  Penicillins and Phenylephrine hcl  Home Medications   Current Outpatient Rx  Name Route Sig Dispense Refill  . CALCIUM CARBONATE ANTACID 500 MG PO CHEW Oral Chew 1 tablet by mouth daily.    Marland Kitchen VITAMIN D 1000 UNITS PO TABS Oral Take 1,000 Units by mouth daily.    Marland Kitchen ESTRADIOL 25 MCG VA TABS Vaginal Place 10 mcg vaginally  2 (two) times a week.    . AZITHROMYCIN 250 MG PO TABS  Take 2 tabs today; then begin one tab once daily for 4 more days (Rx void after 12/25/11) 6 each 0  . BENZONATATE 200 MG PO CAPS Oral Take 1 capsule (200 mg total) by mouth at bedtime. Take as needed for cough 12 capsule 0    BP 129/72  Pulse 64  Temp(Src) 98.4 F (36.9 C) (Oral)  Resp 18  Ht 5\' 3"  (1.6 m)  Wt 118 lb (53.524 kg)  BMI 20.90 kg/m2  SpO2 100%  Physical Exam Nursing notes and Vital Signs reviewed. Appearance:  Patient appears healthy, stated age, and in no acute distress Eyes:  Pupils are equal, round, and reactive to light and accomodation.  Extraocular movement is intact.  Conjunctivae are not inflamed  Ears:  Canals normal.  Tympanic membranes normal.  Nose:  Mildly congested turbinates.  No sinus tenderness.   Pharynx:  Normal Neck:  Supple.  Slightly tender shotty posterior nodes are palpated bilaterally  Lungs:  Clear to auscultation.  Breath sounds are equal.  Heart:  Regular rate and rhythm without murmurs, rubs, or gallops.  Abdomen:  Nontender without masses or hepatosplenomegaly.  Bowel sounds are present.  No  CVA or flank tenderness.  Extremities:  No edema.  No calf tenderness Skin:  No rash present.   ED Course  Procedures  none      1. Acute upper respiratory infections of unspecified site   2. Rhinitis       MDM  There is no evidence of bacterial infection today.   Treat symptomatically for now  Take plain Mucinex (guaifenesin) twice daily for cough and congestion.  Add Sudafed as needed.  Increase fluid intake, rest. May use Afrin nasal spray (or generic oxymetazoline) twice daily for about 5 days.  Also recommend using saline nasal spray several times daily and saline nasal irrigation (AYR is a common brand) Stop all antihistamines for now, and other non-prescription cough/cold preparations. Begin Azithromycin if not improving about 5 days or if persistent fever develops  (Given a  prescription to hold, with an expiration date)  Follow-up with family doctor if not improving 7 to 10 days.         Donna Christen, MD 12/19/11 1452

## 2012-03-21 DIAGNOSIS — G8929 Other chronic pain: Secondary | ICD-10-CM | POA: Insufficient documentation

## 2012-04-26 DIAGNOSIS — L219 Seborrheic dermatitis, unspecified: Secondary | ICD-10-CM | POA: Insufficient documentation

## 2012-05-04 DIAGNOSIS — H2510 Age-related nuclear cataract, unspecified eye: Secondary | ICD-10-CM | POA: Insufficient documentation

## 2012-05-04 DIAGNOSIS — H35379 Puckering of macula, unspecified eye: Secondary | ICD-10-CM | POA: Insufficient documentation

## 2012-05-04 DIAGNOSIS — Z9849 Cataract extraction status, unspecified eye: Secondary | ICD-10-CM | POA: Insufficient documentation

## 2012-05-30 DIAGNOSIS — I491 Atrial premature depolarization: Secondary | ICD-10-CM | POA: Insufficient documentation

## 2012-06-05 DIAGNOSIS — N6049 Mammary duct ectasia of unspecified breast: Secondary | ICD-10-CM | POA: Insufficient documentation

## 2013-07-17 DIAGNOSIS — M65341 Trigger finger, right ring finger: Secondary | ICD-10-CM | POA: Insufficient documentation

## 2014-04-23 DIAGNOSIS — Z79899 Other long term (current) drug therapy: Secondary | ICD-10-CM | POA: Insufficient documentation

## 2014-08-14 DIAGNOSIS — E559 Vitamin D deficiency, unspecified: Secondary | ICD-10-CM | POA: Insufficient documentation

## 2014-08-14 DIAGNOSIS — H3530 Unspecified macular degeneration: Secondary | ICD-10-CM | POA: Insufficient documentation

## 2014-08-14 DIAGNOSIS — E538 Deficiency of other specified B group vitamins: Secondary | ICD-10-CM | POA: Insufficient documentation

## 2015-12-08 DIAGNOSIS — L03114 Cellulitis of left upper limb: Secondary | ICD-10-CM | POA: Insufficient documentation

## 2015-12-09 DIAGNOSIS — S61459A Open bite of unspecified hand, initial encounter: Secondary | ICD-10-CM | POA: Insufficient documentation

## 2015-12-09 DIAGNOSIS — W540XXA Bitten by dog, initial encounter: Secondary | ICD-10-CM | POA: Insufficient documentation

## 2016-01-28 DIAGNOSIS — G4710 Hypersomnia, unspecified: Secondary | ICD-10-CM | POA: Insufficient documentation

## 2018-02-15 ENCOUNTER — Encounter: Payer: Self-pay | Admitting: Gastroenterology

## 2018-02-15 ENCOUNTER — Telehealth: Payer: Self-pay | Admitting: Gastroenterology

## 2018-02-15 NOTE — Telephone Encounter (Signed)
Dr. Silverio Decamp reviewed records and okay to schedule an OV.  Called patient and she will call back to schedule.  Records in the records reviewed folder

## 2018-04-12 ENCOUNTER — Encounter: Payer: Self-pay | Admitting: Gastroenterology

## 2018-04-12 ENCOUNTER — Ambulatory Visit (INDEPENDENT_AMBULATORY_CARE_PROVIDER_SITE_OTHER): Payer: Medicare Other | Admitting: Gastroenterology

## 2018-04-12 ENCOUNTER — Encounter

## 2018-04-12 VITALS — BP 124/68 | HR 72 | Ht 63.0 in | Wt 122.1 lb

## 2018-04-12 DIAGNOSIS — K580 Irritable bowel syndrome with diarrhea: Secondary | ICD-10-CM | POA: Diagnosis not present

## 2018-04-12 NOTE — Progress Notes (Signed)
Kristin Zavala    [id number]    1931/03/26  Primary Care Physician:Bowen, Collene Leyden, DO (Inactive)  Referring Physician: No referring provider defined for this encounter.  Chief complaint: Irritable bowel syndrome-diarrhea HPI: 84 year old Caucasian female here for new patient visit for second opinion of chronic irritable bowel syndrome predominant diarrhea.  She was previously followed by GI at [facility], last office visit Mar 01, 2018. Patient was treated August 2018 with a course of Xifaxan for small intestinal bacterial overgrowth after she had elevated hydrogen level on breath test.  Patient reports no significant improvement with Xifaxan.  She was also prescribed probiotics and was recommended low FODMAP diet with no improvement.  She started taking Benefiber regularly once daily for the past 2 months and diarrhea has improved, no longer having alternating constipation or diarrhea.  She continues to have intermittent bloating and mild generalized abdominal cramps.  Denies any nausea, vomiting, abdominal pain, dark stool or blood per rectum. Colonoscopy November 2005 normal.  No family history of colon polyps or colon cancer.   Outpatient Encounter Medications as of 04/12/2018  Medication Sig  . cholecalciferol (VITAMIN D) 1000 UNITS tablet Take 1,000 Units by mouth daily.  . Multiple Vitamins-Minerals (PRESERVISION AREDS 2) CAPS Take by mouth daily.  . Probiotic Product (PROBIOTIC-10 PO) Take by mouth daily.  . Wheat Dextrin (BENEFIBER PO) Take by mouth daily.  . [DISCONTINUED] calcium carbonate (TUMS - DOSED IN MG ELEMENTAL CALCIUM) 500 MG chewable tablet Chew 1 tablet by mouth daily.  . [DISCONTINUED] estradiol (VAGIFEM) 25 MCG vaginal tablet Place 10 mcg vaginally 2 (two) times a week.   No facility-administered encounter medications on file as of 04/12/2018.     Allergies as of 04/12/2018 - Review Complete 04/12/2018  Allergen Reaction Noted  .  Penicillins  10/11/2006  . Phenylephrine hcl  10/21/2009    Past Medical History:  Diagnosis Date  . Osteoarthritis   . PAC (premature atrial contraction)   . Pancytopenia     Past Surgical History:  Procedure Laterality Date  . CARPAL TUNNEL RELEASE    . catarract      Family History  Problem Relation Age of Onset  . Hypertension Mother   . Diabetes Brother     Social History   Socioeconomic History  . Marital status: Married    Spouse name: Not on file  . Number of children: Not on file  . Years of education: Not on file  . Highest education level: Not on file  Occupational History  . Not on file  Social Needs  . Financial resource strain: Not on file  . Food insecurity:    Worry: Not on file    Inability: Not on file  . Transportation needs:    Medical: Not on file    Non-medical: Not on file  Tobacco Use  . Smoking status: Never Smoker  . Smokeless tobacco: Never Used  Substance and Sexual Activity  . Alcohol use: Yes  . Drug use: No  . Sexual activity: Not on file  Lifestyle  . Physical activity:    Days per week: Not on file    Minutes per session: Not on file  . Stress: Not on file  Relationships  . Social connections:    Talks on phone: Not on file    Gets together: Not on file    Attends religious service: Not on file    Active member of  club or organization: Not on file    Attends meetings of clubs or organizations: Not on file    Relationship status: Not on file  . Intimate partner violence:    Fear of current or ex partner: Not on file    Emotionally abused: Not on file    Physically abused: Not on file    Forced sexual activity: Not on file  Other Topics Concern  . Not on file  Social History Narrative  . Not on file      Review of systems: Review of Systems  Constitutional: Negative for fever and chills.  HENT: Negative.   Eyes: Negative for blurred vision.  Respiratory: Negative for cough, shortness of breath and wheezing.    Cardiovascular: Negative for chest pain and palpitations.  Gastrointestinal: as per HPI Genitourinary: Negative for dysuria, urgency, frequency and hematuria.  Musculoskeletal: Negative for myalgias, back pain and joint pain.  Skin: Negative for itching and rash.  Neurological: Negative for dizziness, tremors, focal weakness, seizures and loss of consciousness.  Endo/Heme/Allergies: Positive for seasonal allergies.  Psychiatric/Behavioral: Negative for depression, suicidal ideas and hallucinations.  All other systems reviewed and are negative.   Physical Exam: Vitals:   04/12/18 0932  BP: 124/68  Pulse: 72   Body mass index is 21.63 kg/m. Gen:      No acute distress HEENT:  EOMI, sclera anicteric Neck:     No masses; no thyromegaly Lungs:    Clear to auscultation bilaterally; normal respiratory effort CV:         Regular rate and rhythm; no murmurs Abd:      + bowel sounds; soft, non-tender; no palpable masses, no distension Ext:    No edema; adequate peripheral perfusion Skin:      Warm and dry; no rash Neuro: alert and oriented x 3 Psych: normal mood and affect  Data Reviewed:  Reviewed labs, radiology imaging, old records and pertinent past GI work up   Assessment and Plan/Recommendations:  84 year old female with history of chronic irritable bowel syndrome predominant diarrhea Significant improvement with regular bowel habits by taking Benefiber once daily Advised patient to titrate up the dose to 2-3 times as needed Increase fluid intake to 8 to 10 cups daily Continue probiotics Return as needed   Greater than 50% of the time used for counseling as well as treatment plan and follow-up. She had multiple questions which were answered to her satisfaction  K. Denzil Magnuson , MD [phone]    CC: No ref. provider found

## 2018-04-12 NOTE — Patient Instructions (Addendum)
Follow up as needed  Continue Benifiber Three times a day   If you are age 82 or older, your body mass index should be between 23-30. Your Body mass index is 21.63 kg/m. If this is out of the aforementioned range listed, please consider follow up with your Primary Care Provider.  If you are age 37 or younger, your body mass index should be between 19-25. Your Body mass index is 21.63 kg/m. If this is out of the aformentioned range listed, please consider follow up with your Primary Care Provider.

## 2018-04-15 ENCOUNTER — Encounter: Payer: Self-pay | Admitting: Gastroenterology

## 2018-09-19 ENCOUNTER — Encounter

## 2018-09-19 ENCOUNTER — Encounter: Payer: Self-pay | Admitting: Gastroenterology

## 2018-09-19 ENCOUNTER — Ambulatory Visit (INDEPENDENT_AMBULATORY_CARE_PROVIDER_SITE_OTHER): Payer: Medicare Other | Admitting: Gastroenterology

## 2018-09-19 ENCOUNTER — Encounter (INDEPENDENT_AMBULATORY_CARE_PROVIDER_SITE_OTHER): Payer: Self-pay

## 2018-09-19 ENCOUNTER — Other Ambulatory Visit (INDEPENDENT_AMBULATORY_CARE_PROVIDER_SITE_OTHER): Payer: Medicare Other

## 2018-09-19 VITALS — BP 108/58 | HR 62 | Ht 63.0 in | Wt 122.1 lb

## 2018-09-19 DIAGNOSIS — K580 Irritable bowel syndrome with diarrhea: Secondary | ICD-10-CM | POA: Diagnosis not present

## 2018-09-19 DIAGNOSIS — R151 Fecal smearing: Secondary | ICD-10-CM | POA: Diagnosis not present

## 2018-09-19 DIAGNOSIS — R143 Flatulence: Secondary | ICD-10-CM

## 2018-09-19 LAB — IGA: IGA: 304 mg/dL (ref 68–378)

## 2018-09-19 NOTE — Patient Instructions (Addendum)
Go to the basement for labs today  Do lactose free diet for 1 week then gluten free diet for 1 week the hold Lactose and gluten for 1 week,  start Zenpep on the fourth week  Take benefiber 1 teaspoon three times a day   We will give you samples of Zenpep to try for 2 days  Take 80,000 units with meals three times a day and 40,000 units with snack for 2 days   If you are age 82 or older, your body mass index should be between 23-30. Your Body mass index is 21.63 kg/m. If this is out of the aforementioned range listed, please consider follow up with your Primary Care Provider.  If you are age 55 or younger, your body mass index should be between 19-25. Your Body mass index is 21.63 kg/m. If this is out of the aformentioned range listed, please consider follow up with your Primary Care Provider.    Lactose-Free Diet, Adult If you have lactose intolerance, you are not able to digest lactose. Lactose is a natural sugar found mainly in milk and milk products. You may need to avoid all foods and beverages that contain lactose. A lactose-free diet can help you do this. What do I need to know about this diet?  Do not consume foods, beverages, vitamins, minerals, or medicines with lactose. Read ingredients lists carefully.  Look for the words "lactose-free" on labels.  Use lactase enzyme drops or tablets as directed by your health care provider.  Use lactose-free milk or a milk alternative, such as soy milk, for drinking and cooking.  Make sure you get enough calcium and vitamin D in your diet. A lactose-free eating plan can be lacking in these important nutrients.  Take calcium and vitamin D supplements as directed by your health care provider. Talk to your provider about supplements if you are not able to get enough calcium and vitamin D from food. Which foods have lactose? Lactose is found in:  Milk and foods made from milk.  Yogurt.  Cheese.  Butter.  Margarine.  Sour  cream.  Cream.  Whipped toppings and nondairy creamers.  Ice cream and other milk-based desserts.  Lactose is also found in foods or products made with milk or milk ingredients. To find out whether a food contains milk or a milk ingredient, look at the ingredients list. Avoid foods with the statement "May contain milk" and foods that contain:  Butter.  Cream.  Milk.  Milk solids.  Milk powder.  Whey.  Curd.  Caseinate.  Lactose.  Lactalbumin.  Lactoglobulin.  What are some alternatives to milk and foods made with milk products?  Lactose-free milk.  Soy milk with added calcium and vitamin D.  Almond, coconut, or rice milk with added calcium and vitamin D. Note that these are low in protein.  Soy products, such as soy yogurt, soy cheese, soy ice cream, and soy-based sour cream. Which foods can I eat? Grains Breads and rolls made without milk, such as [place], [place], or [place] bread, bagels, pita, and [employer]. Corn tortillas, corn meal, grits, and polenta. Crackers without lactose or milk solids, such as soda crackers and graham crackers. Cooked or dry cereals without lactose or milk solids. Pasta, quinoa, couscous, barley, oats, bulgur, farro, rice, wild rice, or other grains prepared without milk or lactose. Plain popcorn. Vegetables Fresh, frozen, and canned vegetables without cheese, cream, or butter sauces. Fruits All fresh, canned, frozen, or dried fruits that are not processed with lactose.  Meats and Other Protein Sources Plain beef, chicken, fish, [place], lamb, veal, pork, wild game, or ham. Kosher-prepared meat products. Strained or junior meats that do not contain milk. Eggs. Soy meat substitutes. Beans, lentils, and hummus. Tofu. Nuts and seeds. Peanut or other nut butters without lactose. Soups, casseroles, and mixed dishes without cheese, cream, or milk. Dairy Lactose-free milk. Soy, rice, or almond milk with added calcium and vitamin D. Soy cheese and  yogurt. Beverages Carbonated drinks. Tea. Coffee, freeze-dried coffee, and some instant coffees. Fruit and vegetable juices. Condiments Soy sauce. Carob powder. Olives. Gravy made with water. Baker's cocoa. Kristin Zavala. Pure seasonings and spices. Ketchup. Mustard. Bouillon. Broth. Sweets and Desserts Water and fruit ices. Gelatin. Cookies, pies, or cakes made from allowed ingredients, such as angel food cake. Pudding made with water or a milk substitute. Lactose-free tofu desserts. Soy, coconut milk, or rice-milk-based frozen desserts. Sugar. Honey. Jam, jelly, and marmalade. Molasses. Pure sugar candy. Dark chocolate without milk. Marshmallows. Fats and Oils Margarines and salad dressings that do not contain milk. Berniece Salines. Vegetable oils. Shortening. Mayonnaise. Soy or coconut-based cream. The items listed above may not be a complete list of recommended foods or beverages. Contact your dietitian for more options. Which foods are not recommended? Grains Breads and rolls that contain milk. Toaster pastries. Muffins, biscuits, waffles, cornbread, and pancakes. These can be prepared at home, commercial, or from mixes. Sweet rolls, donuts, English muffins, fry bread, lefse, flour tortillas with lactose, or [place] toast made with milk or milk ingredients. Crackers that contain lactose. Corn curls. Cooked or dry cereals with lactose. Vegetables Creamed or breaded vegetables. Vegetables in a cheese or butter sauce or with lactose-containing margarines. Instant potatoes. [place] fries. Scalloped or au gratin potatoes. Fruits None. Meats and Other Protein Sources Scrambled eggs, omelets, and souffles that contain milk. Creamed or breaded meat, fish, chicken, or [place]. Sausage products, such as wieners and liver sausage. Cold cuts that contain milk solids. Cheese, cottage cheese, ricotta cheese, and cheese spreads. Lasagna and macaroni and cheese. Pizza. Peanut or other nut butters with added milk solids.  Casseroles or mixed dishes containing milk or cheese. Dairy All dairy products, including milk, goat's milk, buttermilk, kefir, acidophilus milk, flavored milk, evaporated milk, condensed milk, dulce de [place], eggnog, yogurt, cheese, and cheese spreads. Beverages Hot chocolate. Cocoa with lactose. Instant iced teas. Powdered fruit drinks. Smoothies made with milk or yogurt. Condiments Chewing gum that has lactose. Cocoa that has lactose. Spice blends if they contain milk products. Artificial sweeteners that contain lactose. Nondairy creamers. Sweets and Desserts Ice cream, ice milk, gelato, sherbet, and frozen yogurt. Custard, pudding, and mousse. Cake, cream pies, cookies, and other desserts containing milk, cream, cream cheese, or milk chocolate. Pie crust made with milk-containing margarine or butter. Reduced-calorie desserts made with a sugar substitute that contains lactose. Toffee and butterscotch. Milk, white, or dark chocolate that contains milk. Fudge. Caramel. Fats and Oils Margarines and salad dressings that contain milk or cheese. Cream. Half and half. Cream cheese. Sour cream. Chip dips made with sour cream or yogurt. The items listed above may not be a complete list of foods and beverages to avoid. Contact your dietitian for more information. Am I getting enough calcium? Calcium is found in many foods that contain lactose and is important for bone health. The amount of calcium you need depends on your age:  Adults younger than 50 years: 1000 mg of calcium a day.  Adults older than 50 years: 1200 mg of calcium a  day.  If you are not getting enough calcium, other calcium sources include:  Orange juice with calcium added. There are 300-350 mg of calcium in 1 cup of orange juice.  Sardines with edible bones. There are 325 mg of calcium in 3 oz of sardines.  Calcium-fortified soy milk. There are 300-400 mg of calcium in 1 cup of calcium-fortified soy milk.  Calcium-fortified rice  or almond milk. There are 300 mg of calcium in 1 cup of calcium-fortified rice or almond milk.  Canned salmon with edible bones. There are 180 mg of calcium in 3 oz of canned salmon with edible bones.  Calcium-fortified breakfast cereals. There are [phone] mg of calcium in calcium-fortified breakfast cereals.  Tofu set with calcium sulfate. There are 250 mg of calcium in  cup of tofu set with calcium sulfate.  Spinach, cooked. There are 145 mg of calcium in  cup of cooked spinach.  Edamame, cooked. There are 130 mg of calcium in  cup of cooked edamame.  Collard greens, cooked. There are 125 mg of calcium in  cup of cooked collard greens.  Kale, frozen or cooked. There are 90 mg of calcium in  cup of cooked or frozen kale.  Almonds. There are 95 mg of calcium in  cup of almonds.  Broccoli, cooked. There are 60 mg of calcium in 1 cup of cooked broccoli.  This information is not intended to replace advice given to you by your health care provider. Make sure you discuss any questions you have with your health care provider. Document Released: 03/25/2002 Document Revised: 03/10/2016 Document Reviewed: 01/03/2014 Elsevier Interactive Patient Education  2018 [employer].    Gluten-Free Diet for , Adult The gluten-free diet includes all foods that do not contain gluten. Gluten is a protein that is found in wheat, rye, barley, and some other grains. Following the gluten-free diet is the only treatment for people with celiac disease. It helps to prevent damage to the intestines and improves or eliminates the symptoms of celiac disease. Following the gluten-free diet requires some planning. It can be challenging at first, but it gets easier with time and practice. There are more gluten-free options available today than ever before. If you need help finding gluten-free foods or if you have questions, talk with your diet and nutrition specialist (registered dietitian) or your health care  provider. What do I need to know about a gluten-free diet?  All fruits, vegetables, and meats are safe to eat and do not contain gluten.  When grocery shopping, start by shopping in the produce, meat, and dairy sections. These sections are more likely to contain gluten-free foods. Then move to the aisles that contain packaged foods if you need to.  Read all food labels. Gluten is often added to foods. Always check the ingredient list and look for warnings, such as "may contain gluten."  Talk with your dietitian or health care provider before taking a gluten-free multivitamin or mineral supplement.  Be aware of gluten-free foods having contact with foods that contain gluten (cross-contamination). This can happen at home and with any processed foods. ? Talk with your health care provider or dietitian about how to reduce the risk of cross-contamination in your home. ? If you have questions about how a food is processed, ask the manufacturer. What key words help to identify gluten? Foods that list any of these key words on the label usually contain gluten:  Wheat, flour, enriched flour, bromated flour, white flour, durum flour, graham flour,  phosphated flour, self-rising flour, semolina, farina, barley (malt), rye, and oats.  Starch, dextrin, modified food starch, or cereal.  Thickening, fillers, or emulsifiers.  Malt flavoring, malt extract, or malt syrup.  Hydrolyzed vegetable protein.  In the [place] packaged foods that are gluten-free are required to be labeled "GF." These foods should be easy to identify and are safe to eat. In the [place] food companies are also required to list common food allergens, including wheat, on their labels. Recommended foods Grains  Amaranth, bean flours, 100% buckwheat flour, corn, millet, nut flours or nut meals, GF oats, quinoa, rice, sorghum, teff, rice wafers, pure cornmeal tortillas, popcorn, and hot cereals made from cornmeal. Hominy, rice, wild rice.  Some Asian rice noodles or bean noodles. Arrowroot starch, corn bran, corn flour, corn germ, cornmeal, corn starch, potato flour, potato starch flour, and rice bran. Plain, brown, and sweet rice flours. Rice polish, soy flour, and tapioca starch. Vegetables  All plain fresh, frozen, and canned vegetables. Fruits  All plain fresh, frozen, canned, and dried fruits, and 100% fruit juices. Meats and other protein foods  All fresh beef, pork, poultry, fish, seafood, and eggs. Fish canned in water, oil, brine, or vegetable broth. Plain nuts and seeds, peanut butter. Some lunch meat and some frankfurters. Dried beans, dried peas, and lentils. Dairy  Fresh plain, dry, evaporated, or condensed milk. Cream, butter, sour cream, whipping cream, and most yogurts. Unprocessed cheese, most processed cheeses, some cottage cheese, some cream cheeses. Beverages  Coffee, tea, most herbal teas. Carbonated beverages and some root beers. Wine, sake, and distilled spirits, such as gin, vodka, and whiskey. Most hard ciders. Fats and oils  Butter, margarine, vegetable oil, hydrogenated butter, olive oil, shortening, lard, cream, and some mayonnaise. Some commercial salad dressings. Olives. Sweets and desserts  Sugar, honey, some syrups, molasses, jelly, and jam. Plain hard candy, marshmallows, and gumdrops. Pure cocoa powder. Plain chocolate. Custard and some pudding mixes. Gelatin desserts, sorbets, frozen ice pops, and sherbet. Cake, cookies, and other desserts prepared with allowed flours. Some commercial ice creams. Cornstarch, tapioca, and rice puddings. Seasoning and other foods  Some canned or frozen soups. Monosodium glutamate (MSG). Cider, rice, and wine vinegar. Baking soda and baking powder. Cream of tartar. Baking and nutritional yeast. Certain soy sauces made without wheat (ask your dietitian about specific brands that are allowed). Nuts, coconut, and chocolate. Salt, pepper, herbs, spices, flavoring  extracts, imitation or artificial flavorings, natural flavorings, and food colorings. Some medicines and supplements. Some lip glosses and other cosmetics. Rice syrups. The items listed may not be a complete list. Talk with your dietitian about what dietary choices are best for you. Foods to avoid Grains  Barley, bran, bulgur, couscous, cracked wheat, [place], farro, graham, malt, matzo, semolina, wheat germ, and all wheat and rye cereals including spelt and kamut. Cereals containing malt as a flavoring, such as rice cereal. Noodles, spaghetti, macaroni, most packaged rice mixes, and all mixes containing wheat, rye, barley, or triticale. Vegetables  Most creamed vegetables and most vegetables canned in sauces. Some commercially prepared vegetables and salads. Fruits  Thickened or prepared fruits and some pie fillings. Some fruit snacks and fruit roll-ups. Meats and other protein foods  Any meat or meat alternative containing wheat, rye, barley, or gluten stabilizers. These are often marinated or packaged meats and lunch meats. Bread-containing products, such as Swiss steak, croquettes, meatballs, and meatloaf. Most tuna canned in vegetable broth and [place] with hydrolyzed vegetable protein (HVP) injected as part of the  basting. [place]. Imitation fish. Eggs in sauces made from ingredients to avoid. Dairy  Commercial chocolate milk drinks and malted milk. Some non-dairy creamers. Any cheese product containing ingredients to avoid. Beverages  Certain cereal beverages. Beer, ale, malted milk, and some root beers. Some hard ciders. Some instant flavored coffees. Some herbal teas made with barley or with barley malt added. Fats and oils  Some commercial salad dressings. Sour cream containing modified food starch. Sweets and desserts  Some toffees. Chocolate-coated nuts (may be rolled in wheat flour) and some commercial candies and candy bars. Most cakes, cookies, donuts, pastries, and other baked  goods. Some commercial ice cream. Ice cream cones. Commercially prepared mixes for cakes, cookies, and other desserts. Bread pudding and other puddings thickened with flour. Products containing brown rice syrup made with barley malt enzyme. Desserts and sweets made with malt flavoring. Seasoning and other foods  Some curry powders, some dry seasoning mixes, some gravy extracts, some meat sauces, some ketchups, some prepared mustards, and horseradish. Certain soy sauces. Malt vinegar. Bouillon and bouillon cubes that contain HVP. Some chip dips, and some chewing gum. Yeast extract. Brewer's yeast. Caramel color. Some medicines and supplements. Some lip glosses and other cosmetics. The items listed may not be a complete list. Talk with your dietitian about what dietary choices are best for you. Summary  Gluten is a protein that is found in wheat, rye, barley, and some other grains. The gluten-free diet includes all foods that do not contain gluten.  If you need help finding gluten-free foods or if you have questions, talk with your diet and nutrition specialist (registered dietitian) or your health care provider.  Read all food labels. Gluten is often added to foods. Always check the ingredient list and look for warnings, such as "may contain gluten." This information is not intended to replace advice given to you by your health care provider. Make sure you discuss any questions you have with your health care provider. Document Released: 10/03/2005 Document Revised: 07/18/2016 Document Reviewed: 07/18/2016 Elsevier Interactive Patient Education  2018 [employer].

## 2018-09-19 NOTE — Progress Notes (Signed)
Kristin Zavala    [id number]    1930/11/19  Primary Care Physician:Bowen, Collene Leyden, DO (Inactive)  Referring Physician: No referring provider defined for this encounter.  Chief complaint: IBS-Diarrhea  HPI: 84 year old female here for follow-up visit with complaints of diarrhea, excessive flatulence and fecal incontinence She is taking Benefiber 1 tablespoon 3 times daily with meals, has not noticed any significant improvement.  She has excessive flatulence whenever she passes air she also leaks small amount of liquid stool smearing in her undergarments. Patient also noticed greasy stool floating in the toilet bowl 2 weeks ago.  She is trying to avoid wheat-based products in the past 2 days and feels her symptoms are significantly better.  She is also following a lactose-free diet. Denies any abdominal pain, nausea, vomiting, dysphagia, loss of appetite or weight loss.  No dark stool or blood in stool.  Outpatient Encounter Medications as of 09/19/2018  Medication Sig  . calcium carbonate (CALCIUM 600) 600 MG TABS tablet Take by mouth daily.  . cholecalciferol (VITAMIN D) 1000 UNITS tablet Take 1,000 Units by mouth daily.  . dorzolamide-timolol (COSOPT) 22.3-6.8 MG/ML ophthalmic solution INSTILL 1 DROP INTO LEFT EYE 2 TIMES A DAY  . hydrocortisone 2.5 % ointment as needed.  Marland Kitchen ketoconazole (NIZORAL) 2 % cream as needed.  . latanoprost (XALATAN) 0.005 % ophthalmic solution Place 1 drop into both eyes as needed.  . Multiple Vitamins-Minerals (PRESERVISION AREDS 2) CAPS Take by mouth daily.  . Probiotic Product (PROBIOTIC-10 PO) Take by mouth daily.  . valACYclovir (VALTREX) 500 MG tablet as needed.  . Wheat Dextrin (BENEFIBER PO) Take by mouth 3 (three) times daily.    No facility-administered encounter medications on file as of 09/19/2018.     Allergies as of 09/19/2018 - Review Complete 09/19/2018  Allergen Reaction Noted  . Cyclobenzaprine Itching and Rash 02/05/2014   . Alendronate Other (See Comments) and Nausea And Vomiting 04/10/2015  . Atorvastatin Other (See Comments) 08/14/2014  . Oxycodone Dermatitis, Itching, and Rash 01/15/2013  . Tape Rash 04/16/2015  . Penicillins  10/11/2006  . Phenylalanine  09/19/2018  . Phenylephrine hcl  10/21/2009    Past Medical History:  Diagnosis Date  . Osteoarthritis   . PAC (premature atrial contraction)   . Pancytopenia     Past Surgical History:  Procedure Laterality Date  . CARPAL TUNNEL RELEASE    . catarract      Family History  Problem Relation Age of Onset  . Hypertension Mother   . Diabetes Brother   . Colon cancer Maternal Uncle   . Esophageal cancer Neg Hx     Social History   Socioeconomic History  . Marital status: Married    Spouse name: Not on file  . Number of children: Not on file  . Years of education: Not on file  . Highest education level: Not on file  Occupational History  . Not on file  Social Needs  . Financial resource strain: Not on file  . Food insecurity:    Worry: Not on file    Inability: Not on file  . Transportation needs:    Medical: Not on file    Non-medical: Not on file  Tobacco Use  . Smoking status: Former Research scientist (life sciences)  . Smokeless tobacco: Never Used  . Tobacco comment: quit 60 years ago  Substance and Sexual Activity  . Alcohol use: Yes  . Drug use: No  . Sexual activity: Not  on file  Lifestyle  . Physical activity:    Days per week: Not on file    Minutes per session: Not on file  . Stress: Not on file  Relationships  . Social connections:    Talks on phone: Not on file    Gets together: Not on file    Attends religious service: Not on file    Active member of club or organization: Not on file    Attends meetings of clubs or organizations: Not on file    Relationship status: Not on file  . Intimate partner violence:    Fear of current or ex partner: Not on file    Emotionally abused: Not on file    Physically abused: Not on file     Forced sexual activity: Not on file  Other Topics Concern  . Not on file  Social History Narrative  . Not on file      Review of systems: Review of Systems  Constitutional: Negative for fever and chills.  HENT: Negative.   Eyes: Negative for blurred vision.  Respiratory: Negative for cough, shortness of breath and wheezing.   Cardiovascular: Negative for chest pain and palpitations.  Gastrointestinal: as per HPI Genitourinary: Negative for dysuria, urgency, frequency and hematuria.  Musculoskeletal: Negative for myalgias, back pain and positive for joint pain/arthritis.  Skin: Negative for rash.  Neurological: Negative for dizziness, tremors, focal weakness, seizures and loss of consciousness.  Endo/Heme/Allergies: Negative for seasonal allergies.  Psychiatric/Behavioral: Negative for depression, suicidal ideas and hallucinations.  All other systems reviewed and are negative.   Physical Exam: Vitals:   09/19/18 1436  BP: (!) 108/58  Pulse: 62   Body mass index is 21.63 kg/m. Gen:      No acute distress HEENT:  EOMI, sclera anicteric Neck:     No masses; no thyromegaly Lungs:    Clear to auscultation bilaterally; normal respiratory effort CV:         Regular rate and rhythm; no murmurs Abd:      + bowel sounds; soft, non-tender; no palpable masses, no distension Ext:    No edema; adequate peripheral perfusion Skin:      Warm and dry; no rash Neuro: alert and oriented x 3 Psych: normal mood and affect  Data Reviewed:  Reviewed labs, radiology imaging, old records and pertinent past GI work up   Assessment and Plan/Recommendations:  84 year old female with history of hyperlipidemia, depression, anxiety, osteopenia and pancytopenia here with complaints of diarrhea associated with bloating, excessive flatulence and fecal incontinence  Check TTG IgA antibody and IgA level to exclude celiac disease  Continue gluten-free and lactose-free diet for 1 week, advised  patient to add back gluten next week and remain lactose-free diet, subsequent week to add both gluten and lactose to diet  Check stool fecal fat and pancreatic elastase to exclude pancreatic insufficiency 2 weeks from now We will do a trial of pancreatic lipase supplement (Zenpep 40,000 units 2 capsules with meals and 1 capsule with snacks) for 2 days to see if has any improvement of symptoms  If continues to have persistent symptoms will consider starting Colestid 1 g twice daily for possible bile salt induced diarrhea and also consider colonoscopy with biopsies to exclude microscopic colitis  Greater than 50% of the time used for counseling as well as treatment plan and follow-up. She had multiple questions which were answered to her satisfaction  K. Denzil Magnuson , MD [phone]    CC: No ref. provider  found

## 2018-09-20 LAB — TISSUE TRANSGLUTAMINASE, IGA: (tTG) Ab, IgA: 1 U/mL

## 2018-09-21 ENCOUNTER — Encounter: Payer: Self-pay | Admitting: Gastroenterology

## 2018-10-04 ENCOUNTER — Other Ambulatory Visit: Payer: Medicare Other

## 2018-10-04 DIAGNOSIS — K580 Irritable bowel syndrome with diarrhea: Secondary | ICD-10-CM

## 2018-10-13 LAB — FECAL FAT, QUALITATIVE
FAT QUAL NEUTRAL STL: NORMAL
Fat Qual Total, Stl: NORMAL

## 2018-10-13 LAB — PANCREATIC ELASTASE, FECAL: PANCREATIC ELASTASE, FECAL: 282 ug Elast./g (ref >200)

## 2018-10-23 ENCOUNTER — Ambulatory Visit: Payer: Medicare Other | Admitting: Gastroenterology

## 2018-10-24 ENCOUNTER — Ambulatory Visit (INDEPENDENT_AMBULATORY_CARE_PROVIDER_SITE_OTHER): Payer: Medicare Other | Admitting: Gastroenterology

## 2018-10-24 ENCOUNTER — Encounter: Payer: Self-pay | Admitting: Gastroenterology

## 2018-10-24 VITALS — BP 100/58 | HR 52 | Ht 63.0 in | Wt 121.4 lb

## 2018-10-24 DIAGNOSIS — R109 Unspecified abdominal pain: Secondary | ICD-10-CM | POA: Diagnosis not present

## 2018-10-24 DIAGNOSIS — K580 Irritable bowel syndrome with diarrhea: Secondary | ICD-10-CM

## 2018-10-24 MED ORDER — HYOSCYAMINE SULFATE 0.125 MG SL SUBL: 0.1250 mg | SUBLINGUAL_TABLET | Freq: Two times a day (BID) | SUBLINGUAL | 1 refills | Status: DC | PRN

## 2018-10-24 NOTE — Patient Instructions (Signed)
We have given you IBS food symptoms diary to fill out and bring with you to your office appointments  We will send Levsin to your pharmacy   Lactose-Free Diet, Adult If you have lactose intolerance, you are not able to digest lactose. Lactose is a natural sugar found mainly in dairy milk and dairy products. You may need to avoid all foods and beverages that contain lactose. A lactose-free diet can help you do this. Which foods have lactose? Lactose is found in dairy milk and dairy products, such as:  Yogurt.  Cheese.  Butter.  Margarine.  Sour cream.  Cream.  Whipped toppings and nondairy creamers.  Ice cream and other dairy-based desserts. Lactose is also found in foods or products made with dairy milk or milk ingredients. To find out whether a food contains dairy milk or a milk ingredient, look at the ingredients list. Avoid foods with the statement "May contain milk" and foods that contain:  Milk powder.  Whey.  Curd.  Caseinate.  Lactose.  Lactalbumin.  Lactoglobulin. What are alternatives to dairy milk and foods made with milk products?  Lactose-free milk.  Soy milk with added calcium and vitamin D.  Almond milk, coconut milk, rice milk, or other nondairy milk alternatives with added calcium and vitamin D. Note that these are low in protein.  Soy products, such as soy yogurt, soy cheese, soy ice cream, and soy-based sour cream.  Other nut milk products, such as almond yogurt, almond cheese, cashew yogurt, cashew cheese, cashew ice cream, coconut yogurt, and coconut ice cream. What are tips for following this plan?  Do not consume foods, beverages, vitamins, minerals, or medicines containing lactose. Read ingredient lists carefully.  Look for the words "lactose-free" on labels.  Use lactase enzyme drops or tablets as directed by your health care provider.  Use lactose-free milk or a milk alternative, such as soy milk or almond milk, for drinking and  cooking.  Make sure you get enough calcium and vitamin D in your diet. A lactose-free eating plan can be lacking in these important nutrients.  Take calcium and vitamin D supplements as directed by your health care provider. Talk to your health care provider about supplements if you are not able to get enough calcium and vitamin D from food. What foods can I eat?  Fruits All fresh, canned, frozen, or dried fruits that are not processed with lactose. Vegetables All fresh, frozen, and canned vegetables without cheese, cream, or butter sauces. Grains Any that are not made with dairy milk or dairy products. Meats and other proteins Any meat, fish, poultry, and other protein sources that are not made with dairy milk or dairy products. Soy cheese and yogurt. Fats and oils Any that are not made with dairy milk or dairy products. Beverages Lactose-free milk. Soy, rice, or almond milk with added calcium and vitamin D. Fruit and vegetable juices. Sweets and desserts Any that are not made with dairy milk or dairy products. Seasonings and condiments Any that are not made with dairy milk or dairy products. Calcium Calcium is found in many foods that contain lactose and is important for bone health. The amount of calcium you need depends on your age:  Adults younger than 50 years: 1,000 mg of calcium a day.  Adults older than 50 years: 1,200 mg of calcium a day. If you are not getting enough calcium, you may get it from other sources, including:  Orange juice with calcium added. There are 300-350 mg of calcium  in 1 cup of orange juice.  Calcium-fortified soy milk. There are 300-400 mg of calcium in 1 cup of calcium-fortified soy milk.  Calcium-fortified rice or almond milk. There are 300 mg of calcium in 1 cup of calcium-fortified rice or almond milk.  Calcium-fortified breakfast cereals. There are 100-1,000 mg of calcium in calcium-fortified breakfast cereals.  Spinach, cooked. There are  145 mg of calcium in  cup of cooked spinach.  Edamame, cooked. There are 130 mg of calcium in  cup of cooked edamame.  Collard greens, cooked. There are 125 mg of calcium in  cup of cooked collard greens.  Kale, frozen or cooked. There are 90 mg of calcium in  cup of cooked or frozen kale.  Almonds. There are 95 mg of calcium in  cup of almonds.  Broccoli, cooked. There are 60 mg of calcium in 1 cup of cooked broccoli. The items listed above may not be a complete list of recommended foods and beverages. Contact a dietitian for more options. What foods are not recommended? Fruits None, unless they are made with dairy milk or dairy products. Vegetables None, unless they are made with dairy milk or dairy products. Grains Any grains that are made with dairy milk or dairy products. Meats and other proteins None, unless they are made with dairy milk or dairy products. Dairy All dairy products, including milk, goat's milk, buttermilk, kefir, acidophilus milk, flavored milk, evaporated milk, condensed milk, dulce de [place], eggnog, yogurt, cheese, and cheese spreads. Fats and oils Any that are made with milk or milk products. Margarines and salad dressings that contain milk or cheese. Cream. Half and half. Cream cheese. Sour cream. Chip dips made with sour cream or yogurt. Beverages Hot chocolate. Cocoa with lactose. Instant iced teas. Powdered fruit drinks. Smoothies made with dairy milk or yogurt. Sweets and desserts Any that are made with milk or milk products. Seasonings and condiments Chewing gum that has lactose. Spice blends if they contain lactose. Artificial sweeteners that contain lactose. Nondairy creamers. The items listed above may not be a complete list of foods and beverages to avoid. Contact a dietitian for more information. Summary  If you are lactose intolerant, it means that you have a hard time digesting lactose, a natural sugar found in milk and milk  products.  Following a lactose-free diet can help you manage this condition.  Calcium is important for bone health and is found in many foods that contain lactose. Talk with your health care provider about other sources of calcium. This information is not intended to replace advice given to you by your health care provider. Make sure you discuss any questions you have with your health care provider. Document Released: 03/25/2002 Document Revised: 10/31/2017 Document Reviewed: 10/31/2017 Elsevier Interactive Patient Education  2019 [employer].

## 2018-10-24 NOTE — Progress Notes (Signed)
Kristin Zavala    [id number]    05-14-1931  Primary Care Physician:Bowen, Collene Leyden, DO (Inactive)  Referring Physician: No referring provider defined for this encounter.  Chief complaint: Diarrhea, irritable bowel syndrome  HPI: 85 year old female with history of irritable bowel syndrome predominant diarrhea here for follow-up visit.  She is taking Benefiber with improvement of symptoms and has had only 3 episodes of diarrhea in the past 4 weeks.  She is more bothered by fecal urgency and watery stool within the frequency of the episodes.  She does not have any nocturnal symptoms.  Fecal fat and elastase within normal limits.  Denies any nausea, loss of appetite, weight loss, mucus or blood in stool.  She has intermittent right lower quadrant discomfort.   Outpatient Encounter Medications as of 10/24/2018  Medication Sig  . calcium carbonate (CALCIUM 600) 600 MG TABS tablet Take by mouth daily.  . cholecalciferol (VITAMIN D) 1000 UNITS tablet Take 1,000 Units by mouth daily.  . dorzolamide-timolol (COSOPT) 22.3-6.8 MG/ML ophthalmic solution INSTILL 1 DROP INTO LEFT EYE 2 TIMES A DAY  . hydrocortisone 2.5 % ointment as needed.  Marland Kitchen ketoconazole (NIZORAL) 2 % cream as needed.  . latanoprost (XALATAN) 0.005 % ophthalmic solution Place 1 drop into both eyes as needed.  . Multiple Vitamins-Minerals (PRESERVISION AREDS 2) CAPS Take by mouth daily.  . Probiotic Product (PROBIOTIC-10 PO) Take by mouth daily.  . valACYclovir (VALTREX) 500 MG tablet as needed.  . Wheat Dextrin (BENEFIBER PO) Take by mouth 3 (three) times daily.    No facility-administered encounter medications on file as of 10/24/2018.     Allergies as of 10/24/2018 - Review Complete 10/24/2018  Allergen Reaction Noted  . Cyclobenzaprine Itching and Rash 02/05/2014  . Alendronate Other (See Comments) and Nausea And Vomiting 04/10/2015  . Atorvastatin Other (See Comments) 08/14/2014  . Oxycodone Dermatitis,  Itching, and Rash 01/15/2013  . Tape Rash 04/16/2015  . Penicillins  10/11/2006  . Phenylalanine  09/19/2018  . Phenylephrine hcl  10/21/2009    Past Medical History:  Diagnosis Date  . Osteoarthritis   . PAC (premature atrial contraction)   . Pancytopenia     Past Surgical History:  Procedure Laterality Date  . ABDOMINAL HYSTERECTOMY    . BLADDER REPAIR    . CARPAL TUNNEL RELEASE Bilateral   . CATARACT EXTRACTION    . INCISION AND DRAINAGE Left    dog bite-finger  . OVARIAN CYST REMOVAL      Family History  Problem Relation Age of Onset  . Hypertension Mother   . Diabetes Brother   . Colon cancer Maternal Uncle   . Esophageal cancer Neg Hx   . Pancreatic cancer Neg Hx   . Stomach cancer Neg Hx   . Liver disease Neg Hx     Social History   Socioeconomic History  . Marital status: Married    Spouse name: Not on file  . Number of children: Not on file  . Years of education: Not on file  . Highest education level: Not on file  Occupational History  . Not on file  Social Needs  . Financial resource strain: Not on file  . Food insecurity:    Worry: Not on file    Inability: Not on file  . Transportation needs:    Medical: Not on file    Non-medical: Not on file  Tobacco Use  . Smoking status: Former Research scientist (life sciences)  .  Smokeless tobacco: Never Used  . Tobacco comment: quit 60 years ago  Substance and Sexual Activity  . Alcohol use: Yes    Comment: 1-2 glasses wine  . Drug use: No  . Sexual activity: Not on file  Lifestyle  . Physical activity:    Days per week: Not on file    Minutes per session: Not on file  . Stress: Not on file  Relationships  . Social connections:    Talks on phone: Not on file    Gets together: Not on file    Attends religious service: Not on file    Active member of club or organization: Not on file    Attends meetings of clubs or organizations: Not on file    Relationship status: Not on file  . Intimate partner violence:    Fear of  current or ex partner: Not on file    Emotionally abused: Not on file    Physically abused: Not on file    Forced sexual activity: Not on file  Other Topics Concern  . Not on file  Social History Narrative  . Not on file      Review of systems: Review of Systems  Constitutional: Negative for fever and chills.  HENT: Negative.   Eyes: Negative for blurred vision.  Respiratory: Negative for cough, shortness of breath and wheezing.   Cardiovascular: Negative for chest pain and palpitations.  Gastrointestinal: as per HPI Genitourinary: Negative for dysuria, urgency, frequency and hematuria.  Musculoskeletal: Negative for myalgias, back pain and joint pain.  Skin: Negative for itching and rash.  Neurological: Negative for dizziness, tremors, focal weakness, seizures and loss of consciousness.  Endo/Heme/Allergies: Positive for seasonal allergies.  Psychiatric/Behavioral: Negative for depression, suicidal ideas and hallucinations.  All other systems reviewed and are negative.   Physical Exam: Vitals:   10/24/18 0931  BP: (!) 100/58  Pulse: (!) 52   Body mass index is 21.51 kg/m. Gen:      No acute distress HEENT:  EOMI, sclera anicteric Neck:     No masses; no thyromegaly Lungs:    Clear to auscultation bilaterally; normal respiratory effort CV:         Regular rate and rhythm; no murmurs Abd:      + bowel sounds; soft, non-tender; no palpable masses, no distension Ext:    No edema; adequate peripheral perfusion Skin:      Warm and dry; no rash Neuro: alert and oriented x 3 Psych: normal mood and affect  Data Reviewed:  Reviewed labs, radiology imaging, old records and pertinent past GI work up   Assessment and Plan/Recommendations:  85 year old female with irritable bowel syndrome predominant diarrhea Lactose intolerance likely playing a significant role.  Advised patient to do a trial of lactose-free diet Maintain food symptom diary to better identify the  triggers Continue Benefiber 1 to 2 teaspoon 3 times daily with meals Use Levsin 1 tablet twice daily for abdominal discomfort and cramping as needed  25 minutes was spent face-to-face with the patient. Greater than 50% of the time used for counseling as well as treatment plan and follow-up. She had multiple questions which were answered to her satisfaction  K. Denzil Magnuson , MD [phone]    CC: No ref. provider found

## 2018-10-26 ENCOUNTER — Encounter: Payer: Self-pay | Admitting: Gastroenterology

## 2018-12-05 ENCOUNTER — Ambulatory Visit: Payer: Medicare Other | Admitting: Gastroenterology

## 2019-07-11 ENCOUNTER — Encounter: Payer: Self-pay | Admitting: Gastroenterology

## 2019-07-11 ENCOUNTER — Ambulatory Visit (INDEPENDENT_AMBULATORY_CARE_PROVIDER_SITE_OTHER): Payer: Medicare Other | Admitting: Gastroenterology

## 2019-07-11 VITALS — BP 134/60 | HR 60 | Temp 97.9°F | Ht 63.0 in | Wt 118.6 lb

## 2019-07-11 DIAGNOSIS — K580 Irritable bowel syndrome with diarrhea: Secondary | ICD-10-CM | POA: Diagnosis not present

## 2019-07-11 NOTE — Patient Instructions (Signed)
Please continue to keep a food symptom diary.  We have given you samples of the following medication to take: IB Gard- 1 capsule three times daily as needed (you can get additional over the counter)  Please follow up in 3 months.  If you are age 83 or older, your body mass index should be between 23-30. Your Body mass index is 21.01 kg/m. If this is out of the aforementioned range listed, please consider follow up with your Primary Care Provider.  If you are age 81 or younger, your body mass index should be between 19-25. Your Body mass index is 21.01 kg/m. If this is out of the aformentioned range listed, please consider follow up with your Primary Care Provider.

## 2019-07-11 NOTE — Progress Notes (Signed)
Kristin Zavala    [id number]    12/23/1930  Primary Care Physician:Kincaid, Sedalia Muta, MD  Referring Physician: No referring provider defined for this encounter.   Chief complaint:  IBS-D  HPI: 85 year old female with history of irritable bowel syndrome predominant diarrhea here for follow-up visit.  Since she started taking Benefiber 3 times daily she feels her bowel habits have improved.  She is only having 2 bowel movements daily on average. Fecal fat and elastase within normal limits, negative for pancreatic insufficiency  Denies any nausea, vomiting, abdominal pain, melena or bright red blood per rectum    Outpatient Encounter Medications as of 07/11/2019  Medication Sig  . calcium carbonate (CALCIUM 600) 600 MG TABS tablet Take by mouth daily.  . cholecalciferol (VITAMIN D) 1000 UNITS tablet Take 1,000 Units by mouth daily.  . dorzolamide-timolol (COSOPT) 22.3-6.8 MG/ML ophthalmic solution INSTILL 1 DROP INTO LEFT EYE 2 TIMES A DAY  . escitalopram (LEXAPRO) 5 MG tablet Take 5 mg by mouth daily.  . hydrocortisone 2.5 % ointment as needed.  . hyoscyamine (LEVSIN SL) 0.125 MG SL tablet Place 1 tablet (0.125 mg total) under the tongue 2 (two) times daily as needed.  Marland Kitchen ketoconazole (NIZORAL) 2 % cream as needed.  . latanoprost (XALATAN) 0.005 % ophthalmic solution Place 1 drop into both eyes as needed.  . Multiple Vitamins-Minerals (PRESERVISION AREDS 2) CAPS Take by mouth daily.  . Probiotic Product (PROBIOTIC-10 PO) Take by mouth daily.  . valACYclovir (VALTREX) 500 MG tablet as needed.  . Wheat Dextrin (BENEFIBER PO) Take by mouth 3 (three) times daily.    No facility-administered encounter medications on file as of 07/11/2019.     Allergies as of 07/11/2019 - Review Complete 07/11/2019  Allergen Reaction Noted  . Cyclobenzaprine Itching and Rash 02/05/2014  . Alendronate Other (See Comments) and Nausea And Vomiting 04/10/2015  . Atorvastatin Other (See  Comments) 08/14/2014  . Oxycodone Dermatitis, Itching, and Rash 01/15/2013  . Tape Rash 04/16/2015  . Penicillins  10/11/2006  . Phenylalanine  09/19/2018  . Phenylephrine hcl  10/21/2009    Past Medical History:  Diagnosis Date  . Osteoarthritis   . PAC (premature atrial contraction)   . Pancytopenia     Past Surgical History:  Procedure Laterality Date  . ABDOMINAL HYSTERECTOMY    . BLADDER REPAIR    . CARPAL TUNNEL RELEASE Bilateral   . CATARACT EXTRACTION    . INCISION AND DRAINAGE Left    dog bite-finger  . OVARIAN CYST REMOVAL      Family History  Problem Relation Age of Onset  . Hypertension Mother   . Diabetes Brother   . Colon cancer Maternal Uncle   . Esophageal cancer Neg Hx   . Pancreatic cancer Neg Hx   . Stomach cancer Neg Hx   . Liver disease Neg Hx     Social History   Socioeconomic History  . Marital status: Married    Spouse name: Not on file  . Number of children: Not on file  . Years of education: Not on file  . Highest education level: Not on file  Occupational History  . Not on file  Social Needs  . Financial resource strain: Not on file  . Food insecurity    Worry: Not on file    Inability: Not on file  . Transportation needs    Medical: Not on file    Non-medical: Not on file  Tobacco Use  . Smoking status: Former Research scientist (life sciences)  . Smokeless tobacco: Never Used  . Tobacco comment: quit 60 years ago  Substance and Sexual Activity  . Alcohol use: Yes    Comment: 1-2 glasses wine  . Drug use: No  . Sexual activity: Not on file  Lifestyle  . Physical activity    Days per week: Not on file    Minutes per session: Not on file  . Stress: Not on file  Relationships  . Social Herbalist on phone: Not on file    Gets together: Not on file    Attends religious service: Not on file    Active member of club or organization: Not on file    Attends meetings of clubs or organizations: Not on file    Relationship status: Not on file   . Intimate partner violence    Fear of current or ex partner: Not on file    Emotionally abused: Not on file    Physically abused: Not on file    Forced sexual activity: Not on file  Other Topics Concern  . Not on file  Social History Narrative  . Not on file      Review of systems: Review of Systems  Constitutional: Negative for fever and chills.  HENT: Negative.   Eyes: Negative for blurred vision.  Respiratory: Negative for cough, shortness of breath and wheezing.   Cardiovascular: Negative for chest pain and palpitations.  Gastrointestinal: as per HPI Genitourinary: Negative for dysuria, urgency, frequency and hematuria.  Musculoskeletal: Negative for myalgias, back pain and joint pain.  Skin: Negative for itching and rash.  Neurological: Negative for dizziness, tremors, focal weakness, seizures and loss of consciousness.  Endo/Heme/Allergies: Positive for seasonal allergies.  Psychiatric/Behavioral: Negative for depression, suicidal ideas and hallucinations.  All other systems reviewed and are negative.   Physical Exam: Vitals:   07/11/19 1048  Temp: 97.9 F (36.6 C)   Body mass index is 21.01 kg/m. Gen:      No acute distress HEENT:  EOMI, sclera anicteric Neck:     No masses; no thyromegaly Lungs:    Clear to auscultation bilaterally; normal respiratory effort CV:         Regular rate and rhythm; no murmurs Abd:      + bowel sounds; soft, non-tender; no palpable masses, no distension Ext:    No edema; adequate peripheral perfusion Skin:      Warm and dry; no rash Neuro: alert and oriented x 3 Psych: normal mood and affect  Data Reviewed:  Reviewed labs, radiology imaging, old records and pertinent past GI work up   Assessment and Plan/Recommendations:  85 year old female with irritable bowel syndrome predominant diarrhea here for follow-up visit Overall symptoms significantly improved Maintain food symptom diary to keep track of IBS symptoms  Continue lactose-free diet Continue Benefiber 3 times daily with meals We will do trial of IBgard 1 capsule up to 3 times daily as needed for abdominal cramping and IBS symptoms Return in 3 months or sooner if needed  25 minutes was spent face-to-face with the patient. Greater than 50% of the time used for counseling as well as treatment plan and follow-up. She had multiple questions which were answered to her satisfaction  K. Denzil Magnuson , MD    CC: No ref. provider found

## 2019-07-17 ENCOUNTER — Encounter: Payer: Self-pay | Admitting: Gastroenterology

## 2019-10-04 ENCOUNTER — Ambulatory Visit (INDEPENDENT_AMBULATORY_CARE_PROVIDER_SITE_OTHER): Payer: Medicare Other | Admitting: Gastroenterology

## 2019-10-04 ENCOUNTER — Encounter: Payer: Self-pay | Admitting: Gastroenterology

## 2019-10-04 VITALS — BP 102/52 | HR 48 | Temp 97.6°F | Ht 63.0 in | Wt 119.4 lb

## 2019-10-04 DIAGNOSIS — K580 Irritable bowel syndrome with diarrhea: Secondary | ICD-10-CM | POA: Diagnosis not present

## 2019-10-04 DIAGNOSIS — R197 Diarrhea, unspecified: Secondary | ICD-10-CM

## 2019-10-04 DIAGNOSIS — R109 Unspecified abdominal pain: Secondary | ICD-10-CM | POA: Diagnosis not present

## 2019-10-04 MED ORDER — DIPHENOXYLATE-ATROPINE 2.5-0.025 MG PO TABS: 1.0000 | ORAL_TABLET | Freq: Every day | ORAL | 1 refills | Status: AC

## 2019-10-04 NOTE — Progress Notes (Signed)
Kristin Zavala    [id number]    08-Oct-1931  Primary Care Physician:Kincaid, Sedalia Muta, MD  Referring Physician: Azzie Roup, MD [address] [place],  [place] [address]   Chief complaint:  Diarrhea, IBS  HPI: 85 year old female with history of chronic irritable bowel syndrome predominant diarrhea here for follow-up visit she continues to have intermittent diarrhea slightly improved compared to prior.  She has an episode once a week on average.  She is taking Benefiber with meals and is also trying to avoid milk and dairy products. For a week she ate only bland diet mostly chicken, mashed potatoes and cooked vegetables and did not have any episodes of diarrhea but she said it was hard to eat like that every day.  Denies any blood in stool, melena, vomiting, dysphagia, loss of appetite or weight loss.  She has intermittent abdominal cramping, worse when she has multiple episodes of diarrhea.  No significant improvement with IBgard.  Fecal elastase and fecal fat within normal range.  TTG IgA antibody negative for celiac disease  No family history of colon cancer or IBD   Outpatient Encounter Medications as of 10/04/2019  Medication Sig  . calcium carbonate (CALCIUM 600) 600 MG TABS tablet Take by mouth daily.  . cholecalciferol (VITAMIN D) 1000 UNITS tablet Take 1,000 Units by mouth daily.  . dorzolamide-timolol (COSOPT) 22.3-6.8 MG/ML ophthalmic solution INSTILL 1 DROP INTO LEFT EYE 2 TIMES A DAY  . hydrocortisone 2.5 % ointment as needed.  Marland Kitchen ketoconazole (NIZORAL) 2 % cream as needed.  . latanoprost (XALATAN) 0.005 % ophthalmic solution Place 1 drop into both eyes as needed.  . Multiple Vitamins-Minerals (PRESERVISION AREDS 2) CAPS Take by mouth daily.  . Probiotic Product (PROBIOTIC-10 PO) Take by mouth daily.  . valACYclovir (VALTREX) 500 MG tablet as needed.  . Wheat Dextrin (BENEFIBER PO) Take by mouth 3 (three) times daily.   .  [DISCONTINUED] escitalopram (LEXAPRO) 5 MG tablet Take 5 mg by mouth daily.  . [DISCONTINUED] hyoscyamine (LEVSIN SL) 0.125 MG SL tablet Place 1 tablet (0.125 mg total) under the tongue 2 (two) times daily as needed. (Patient not taking: Reported on 10/04/2019)   No facility-administered encounter medications on file as of 10/04/2019.    Allergies as of 10/04/2019 - Review Complete 10/04/2019  Allergen Reaction Noted  . Cyclobenzaprine Itching and Rash 02/05/2014  . Alendronate Other (See Comments) and Nausea And Vomiting 04/10/2015  . Atorvastatin Other (See Comments) 08/14/2014  . Oxycodone Dermatitis, Itching, and Rash 01/15/2013  . Tape Rash 04/16/2015  . Penicillins  10/11/2006  . Phenylalanine  09/19/2018  . Phenylephrine hcl  10/21/2009    Past Medical History:  Diagnosis Date  . Irritable bowel syndrome with diarrhea   . Osteoarthritis   . PAC (premature atrial contraction)   . Pancytopenia     Past Surgical History:  Procedure Laterality Date  . ABDOMINAL HYSTERECTOMY    . BLADDER REPAIR    . BREAST SURGERY Right    ductal atresion  . BUNIONECTOMY Left   . CARPAL TUNNEL RELEASE Bilateral   . CATARACT EXTRACTION Bilateral   . INCISION AND DRAINAGE Left    dog bite-finger  . OVARIAN CYST REMOVAL      Family History  Problem Relation Age of Onset  . Hypertension Mother   . Diabetes Brother   . Colon cancer Maternal Uncle   . Esophageal cancer Neg Hx   .  Pancreatic cancer Neg Hx   . Stomach cancer Neg Hx   . Liver disease Neg Hx     Social History   Socioeconomic History  . Marital status: Married    Spouse name: Not on file  . Number of children: Not on file  . Years of education: Not on file  . Highest education level: Not on file  Occupational History  . Not on file  Tobacco Use  . Smoking status: Former Research scientist (life sciences)  . Smokeless tobacco: Never Used  . Tobacco comment: quit 60 years ago  Substance and Sexual Activity  . Alcohol use: Not Currently     Comment: none  . Drug use: No  . Sexual activity: Not on file  Other Topics Concern  . Not on file  Social History Narrative  . Not on file   Social Determinants of Health   Financial Resource Strain:   . Difficulty of Paying Living Expenses: Not on file  Food Insecurity:   . Worried About Charity fundraiser in the Last Year: Not on file  . Ran Out of Food in the Last Year: Not on file  Transportation Needs:   . Lack of Transportation (Medical): Not on file  . Lack of Transportation (Non-Medical): Not on file  Physical Activity:   . Days of Exercise per Week: Not on file  . Minutes of Exercise per Session: Not on file  Stress:   . Feeling of Stress : Not on file  Social Connections:   . Frequency of Communication with Friends and Family: Not on file  . Frequency of Social Gatherings with Friends and Family: Not on file  . Attends Religious Services: Not on file  . Active Member of Clubs or Organizations: Not on file  . Attends Archivist Meetings: Not on file  . Marital Status: Not on file  Intimate Partner Violence:   . Fear of Current or Ex-Partner: Not on file  . Emotionally Abused: Not on file  . Physically Abused: Not on file  . Sexually Abused: Not on file      Review of systems: Review of Systems  Constitutional: Negative for fever and chills.  HENT: Negative.   Eyes: Negative for blurred vision.  Respiratory: Negative for cough, shortness of breath and wheezing.   Cardiovascular: Negative for chest pain and palpitations.  Gastrointestinal: as per HPI Genitourinary: Negative for dysuria, urgency, frequency and hematuria.  Musculoskeletal: Positive for myalgias, back pain and joint pain.  Skin: Negative for itching and rash.  Neurological: Negative for dizziness, tremors, focal weakness, seizures and loss of consciousness.  Endo/Heme/Allergies: Negative Psychiatric/Behavioral: Negative for depression, suicidal ideas and hallucinations.  All other  systems reviewed and are negative.   Physical Exam: Vitals:   10/04/19 0901  BP: (!) 102/52  Pulse: (!) 48  Temp: 97.6 F (36.4 C)  SpO2: 100%   Body mass index is 21.15 kg/m. Gen:      No acute distress HEENT:  EOMI, sclera anicteric Neck:     No masses; no thyromegaly Lungs:    Clear to auscultation bilaterally; normal respiratory effort CV:         Regular rate and rhythm; no murmurs Abd:      + bowel sounds; soft, non-tender; no palpable masses, no distension Ext:    No edema; adequate peripheral perfusion Skin:      Warm and dry; no rash Neuro: alert and oriented x 3 Psych: normal mood and affect  Data Reviewed:  Reviewed labs, radiology imaging, old records and pertinent past GI work up   Assessment and Plan/Recommendations:  85 year old female with irritable bowel syndrome predominant diarrhea for follow-up visit  Recommended flexible sigmoidoscopy with biopsies to exclude microscopic colitis for evaluation of persistent symptoms and diarrhea, discussed potential risks associated with the procedure and benefit. Patient would like to hold off for now given her age  76 her to keep track of food and symptoms with food symptom diary  Continue to follow lactose-free diet  Continue Benefiber 1 tablespoon 3 times daily with meals  Lomotil 1 tablet daily as needed for severe symptoms of diarrhea and abdominal cramping  Return in 3 months or sooner if needed  25 minutes was spent face-to-face with the patient. Greater than 50% of the time used for counseling as well as treatment plan and follow-up. She had multiple questions which were answered to her satisfaction  K. Denzil Magnuson , MD    CC: Azzie Roup, MD

## 2019-10-04 NOTE — Patient Instructions (Signed)
We have sent Lomotil to your pharmacy  Take Benefiber 1 tablespoon three times a day with meals  We have given you IBS Food Diary symptom forms to fill out and bring back with you at your next appointment   Lactose-Free Diet, Adult If you have lactose intolerance, you are not able to digest lactose. Lactose is a natural sugar found mainly in dairy milk and dairy products. You may need to avoid all foods and beverages that contain lactose. A lactose-free diet can help you do this. Which foods have lactose? Lactose is found in dairy milk and dairy products, such as:  Yogurt.  Cheese.  Butter.  Margarine.  Sour cream.  Cream.  Whipped toppings and nondairy creamers.  Ice cream and other dairy-based desserts. Lactose is also found in foods or products made with dairy milk or milk ingredients. To find out whether a food contains dairy milk or a milk ingredient, look at the ingredients list. Avoid foods with the statement "May contain milk" and foods that contain:  Milk powder.  Whey.  Curd.  Caseinate.  Lactose.  Lactalbumin.  Lactoglobulin. What are alternatives to dairy milk and foods made with milk products?  Lactose-free milk.  Soy milk with added calcium and vitamin D.  Almond milk, coconut milk, rice milk, or other nondairy milk alternatives with added calcium and vitamin D. Note that these are low in protein.  Soy products, such as soy yogurt, soy cheese, soy ice cream, and soy-based sour cream.  Other nut milk products, such as almond yogurt, almond cheese, cashew yogurt, cashew cheese, cashew ice cream, coconut yogurt, and coconut ice cream. What are tips for following this plan?  Do not consume foods, beverages, vitamins, minerals, or medicines containing lactose. Read ingredient lists carefully.  Look for the words "lactose-free" on labels.  Use lactase enzyme drops or tablets as directed by your health care provider.  Use lactose-free milk or a milk  alternative, such as soy milk or almond milk, for drinking and cooking.  Make sure you get enough calcium and vitamin D in your diet. A lactose-free eating plan can be lacking in these important nutrients.  Take calcium and vitamin D supplements as directed by your health care provider. Talk to your health care provider about supplements if you are not able to get enough calcium and vitamin D from food. What foods can I eat?  Fruits All fresh, canned, frozen, or dried fruits that are not processed with lactose. Vegetables All fresh, frozen, and canned vegetables without cheese, cream, or butter sauces. Grains Any that are not made with dairy milk or dairy products. Meats and other proteins Any meat, fish, poultry, and other protein sources that are not made with dairy milk or dairy products. Soy cheese and yogurt. Fats and oils Any that are not made with dairy milk or dairy products. Beverages Lactose-free milk. Soy, rice, or almond milk with added calcium and vitamin D. Fruit and vegetable juices. Sweets and desserts Any that are not made with dairy milk or dairy products. Seasonings and condiments Any that are not made with dairy milk or dairy products. Calcium Calcium is found in many foods that contain lactose and is important for bone health. The amount of calcium you need depends on your age:  Adults younger than 50 years: 1,000 mg of calcium a day.  Adults older than 50 years: 1,200 mg of calcium a day. If you are not getting enough calcium, you may get it from other sources,  including:  Orange juice with calcium added. There are 300-350 mg of calcium in 1 cup of orange juice.  Calcium-fortified soy milk. There are 300-400 mg of calcium in 1 cup of calcium-fortified soy milk.  Calcium-fortified rice or almond milk. There are 300 mg of calcium in 1 cup of calcium-fortified rice or almond milk.  Calcium-fortified breakfast cereals. There are 100-1,000 mg of calcium in  calcium-fortified breakfast cereals.  Spinach, cooked. There are 145 mg of calcium in  cup of cooked spinach.  Edamame, cooked. There are 130 mg of calcium in  cup of cooked edamame.  Collard greens, cooked. There are 125 mg of calcium in  cup of cooked collard greens.  Kale, frozen or cooked. There are 90 mg of calcium in  cup of cooked or frozen kale.  Almonds. There are 95 mg of calcium in  cup of almonds.  Broccoli, cooked. There are 60 mg of calcium in 1 cup of cooked broccoli. The items listed above may not be a complete list of recommended foods and beverages. Contact a dietitian for more options. What foods are not recommended? Fruits None, unless they are made with dairy milk or dairy products. Vegetables None, unless they are made with dairy milk or dairy products. Grains Any grains that are made with dairy milk or dairy products. Meats and other proteins None, unless they are made with dairy milk or dairy products. Dairy All dairy products, including milk, goat's milk, buttermilk, kefir, acidophilus milk, flavored milk, evaporated milk, condensed milk, dulce de [place], eggnog, yogurt, cheese, and cheese spreads. Fats and oils Any that are made with milk or milk products. Margarines and salad dressings that contain milk or cheese. Cream. Half and half. Cream cheese. Sour cream. Chip dips made with sour cream or yogurt. Beverages Hot chocolate. Cocoa with lactose. Instant iced teas. Powdered fruit drinks. Smoothies made with dairy milk or yogurt. Sweets and desserts Any that are made with milk or milk products. Seasonings and condiments Chewing gum that has lactose. Spice blends if they contain lactose. Artificial sweeteners that contain lactose. Nondairy creamers. The items listed above may not be a complete list of foods and beverages to avoid. Contact a dietitian for more information. Summary  If you are lactose intolerant, it means that you have a hard time  digesting lactose, a natural sugar found in milk and milk products.  Following a lactose-free diet can help you manage this condition.  Calcium is important for bone health and is found in many foods that contain lactose. Talk with your health care provider about other sources of calcium. This information is not intended to replace advice given to you by your health care provider. Make sure you discuss any questions you have with your health care provider. Document Released: 03/25/2002 Document Revised: 10/31/2017 Document Reviewed: 10/31/2017 Elsevier Patient Education  [place].  Follow up in 4 months  I appreciate the  opportunity to care for you  Thank You   Harl Bowie , MD

## 2019-10-06 ENCOUNTER — Encounter: Payer: Self-pay | Admitting: Gastroenterology

## 2019-10-19 ENCOUNTER — Other Ambulatory Visit: Payer: Self-pay

## 2019-10-19 ENCOUNTER — Emergency Department (INDEPENDENT_AMBULATORY_CARE_PROVIDER_SITE_OTHER): Payer: Medicare Other

## 2019-10-19 ENCOUNTER — Emergency Department (INDEPENDENT_AMBULATORY_CARE_PROVIDER_SITE_OTHER)
Admission: EM | Admit: 2019-10-19 | Discharge: 2019-10-19 | Disposition: A | Discharge status: Home / Self Care | Payer: Medicare Other | Source: Home / Self Care | Attending: Family Medicine | Admitting: Family Medicine

## 2019-10-19 DIAGNOSIS — S93602A Unspecified sprain of left foot, initial encounter: Secondary | ICD-10-CM | POA: Diagnosis not present

## 2019-10-19 DIAGNOSIS — M79672 Pain in left foot: Secondary | ICD-10-CM | POA: Diagnosis not present

## 2019-10-19 NOTE — Discharge Instructions (Addendum)
If swelling occurs, apply ice pack for 20 to 30 minutes, 3 to 4 times daily  Continue until pain and swelling decrease.  May take Tylenol if needed for pain.  Begin range of motion and stretching exercises as tolerated.

## 2019-10-19 NOTE — ED Provider Notes (Signed)
[facility]    CSN: [id number] Arrival date & time: 10/19/19  1357      History   Chief Complaint Chief Complaint  Patient presents with  . Foot Pain    LT    HPI Kristin Zavala is a 86 y.o. female.   While beginning yoga last night, patient twisted her left foot and has had persistent pain in the dorsum of her foot.  The history is provided by the patient.  Foot Injury Location:  Foot Time since incident:  1 day Injury: yes   Mechanism of injury comment:  Twisted foot Foot location:  Dorsum of L foot Pain details:    Quality:  Aching   Radiates to:  Does not radiate   Severity:  Moderate   Onset quality:  Sudden   Duration:  1 day   Timing:  Constant   Progression:  Unchanged Chronicity:  New Prior injury to area:  No Relieved by:  Nothing Worsened by:  Bearing weight Ineffective treatments:  None tried Associated symptoms: decreased ROM and swelling   Associated symptoms: no muscle weakness, no numbness, no stiffness and no tingling     Past Medical History:  Diagnosis Date  . Irritable bowel syndrome with diarrhea   . Osteoarthritis   . PAC (premature atrial contraction)   . Pancytopenia     Patient Active Problem List   Diagnosis Date Noted  . PANCYTOPENIA 12/25/2009  . NEVUS, ATYPICAL 11/10/2009  . DYSFUNCTION OF EUSTACHIAN TUBE 11/10/2009  . ALLERGIC RHINITIS 08/27/2009  . HEARING LOSS 03/19/2009  . 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 HYPERLIPIDEMIA-MIXED 12/04/2008  . Headache(784.0) 12/04/2008  . BACK PAIN, LUMBAR 06/12/2008  . HIP PAIN, LEFT 03/12/2008  . SYNDROME, CARPAL TUNNEL 02/13/2007  . DEPRESSION, MAJOR, RECURRENT 07/25/2006  . ANXIETY 07/25/2006  . OSTEOPENIA 07/25/2006  . PALPITATIONS 07/25/2006    Past Surgical History:  Procedure Laterality Date  . ABDOMINAL HYSTERECTOMY    . BLADDER REPAIR    . BREAST SURGERY Right    ductal atresion  . BUNIONECTOMY Left   . CARPAL TUNNEL RELEASE Bilateral   . CATARACT  EXTRACTION Bilateral   . INCISION AND DRAINAGE Left    dog bite-finger  . OVARIAN CYST REMOVAL      OB History   No obstetric history on file.      Home Medications    Prior to Admission medications   Medication Sig Start Date End Date Taking? Authorizing Provider  calcium carbonate (CALCIUM 600) 600 MG TABS tablet Take by mouth daily.    [provider]  cholecalciferol (VITAMIN D) 1000 UNITS tablet Take 1,000 Units by mouth daily.    [provider]  diphenoxylate-atropine (LOMOTIL) 2.5-0.025 MG tablet Take 1 tablet by mouth daily. As needed 10/04/19   Mauri Pole, MD  dorzolamide-timolol (COSOPT) 22.3-6.8 MG/ML ophthalmic solution INSTILL 1 DROP INTO LEFT EYE 2 TIMES A DAY 09/05/18   [provider]  hydrocortisone 2.5 % ointment as needed. 12/20/17   [provider]  ketoconazole (NIZORAL) 2 % cream as needed. 12/20/17   [provider]  latanoprost (XALATAN) 0.005 % ophthalmic solution Place 1 drop into both eyes as needed.    [provider]  Multiple Vitamins-Minerals (PRESERVISION AREDS 2) CAPS Take by mouth daily.    [provider]  Probiotic Product (PROBIOTIC-10 PO) Take by mouth daily.    [provider]  valACYclovir (VALTREX) 500 MG tablet as needed. 03/17/14   [provider]  Wheat Dextrin (BENEFIBER PO) Take by mouth 3 (three) times daily.     [provider]    Family History Family History  Problem Relation Age of Onset  . Hypertension Mother   . Diabetes Brother   . Colon cancer Maternal Uncle   . Esophageal cancer Neg Hx   . Pancreatic cancer Neg Hx   . Stomach cancer Neg Hx   . Liver disease Neg Hx     Social History Social History   Tobacco Use  . Smoking status: Former Research scientist (life sciences)  . Smokeless tobacco: Never Used  . Tobacco comment: quit 60 years ago  Substance Use Topics  . Alcohol use: Not Currently    Comment: none  . Drug use: No     Allergies     Cyclobenzaprine, Alendronate, Atorvastatin, Oxycodone, Tape, Penicillins, Phenylalanine, and Phenylephrine hcl   Review of Systems Review of Systems  Musculoskeletal: Negative for stiffness.  All other systems reviewed and are negative.    Physical Exam Triage Vital Signs ED Triage Vitals [10/19/19 1604]  Enc Vitals Group     BP 127/68     Pulse Rate 60     Resp 18     Temp 97.8 F (36.6 C)     Temp Source Oral     SpO2 95 %     Weight 119 lb 0.8 oz (54 kg)     Height 5\' 3"  (1.6 m)     Head Circumference      Peak Flow      Pain Score 2     Pain Loc      Pain Edu?      Excl. in [place]?    No data found.  Updated Vital Signs BP 127/68 (BP Location: Right Arm)   Pulse 60   Temp 97.8 F (36.6 C) (Oral)   Resp 18   Ht 5\' 3"  (1.6 m)   Wt 54 kg   SpO2 95%   BMI 21.09 kg/m   Visual Acuity Right Eye Distance:   Left Eye Distance:   Bilateral Distance:    Right Eye Near:   Left Eye Near:    Bilateral Near:     Physical Exam Vitals and nursing note reviewed.  Constitutional:      General: She is not in acute distress. HENT:     Head: Normocephalic.  Eyes:     Pupils: Pupils are equal, round, and reactive to light.  Cardiovascular:     Rate and Rhythm: Normal rate.  Pulmonary:     Effort: Pulmonary effort is normal.  Musculoskeletal:     Left foot: Decreased range of motion. Normal capillary refill. Tenderness and bony tenderness present. No swelling, deformity or crepitus.       Feet:     Comments: There is tenderness to palpation dorsum left foot as noted on diagram.  Distal neurovascular function is intact.   Skin:    General: Skin is warm and dry.  Neurological:     Mental Status: She is alert.      UC Treatments / Results  Labs (all labs ordered are listed, but only abnormal results are displayed) Labs Reviewed - No data to display  EKG   Radiology DG Foot Complete Left  Result Date: 10/19/2019 CLINICAL DATA:  Pain EXAM: LEFT FOOT -  COMPLETE 3+ VIEW COMPARISON:  None. FINDINGS: The patient is status post prior screw fixation of the first metatarsal and proximal phalanx of the first digit. The  hardware is grossly intact. There is no obvious acute displaced fracture. No dislocation. No significant surrounding soft tissue swelling. IMPRESSION: No acute displaced fracture. Electronically Signed   By: Constance Holster M.D.   On: 10/19/2019 16:20    Procedures Procedures (including critical care time)  Medications Ordered in UC Medications - No data to display  Initial Impression / Assessment and Plan / UC Course  I have reviewed the triage vital signs and the nursing notes.  Pertinent labs & imaging results that were available during my care of the patient were reviewed by me and considered in my medical decision making (see chart for details).     Followup with Dr. Aundria Mems ([facility]) if not improving about two weeks.  Final Clinical Impressions(s) / UC Diagnoses   Final diagnoses:  Foot sprain, left, initial encounter     Discharge Instructions     If swelling occurs, apply ice pack for 20 to 30 minutes, 3 to 4 times daily  Continue until pain and swelling decrease.  May take Tylenol if needed for pain.  Begin range of motion and stretching exercises as tolerated.    ED Prescriptions    None        Kandra Nicolas, MD 10/25/19 [phone]

## 2019-10-19 NOTE — ED Triage Notes (Signed)
Pt c/o LT foot pain since last night. Was doing yoga for first time when she twisted her foot wrong and heard a crack. Pain 2/10.

## 2020-01-16 DIAGNOSIS — F329 Major depressive disorder, single episode, unspecified: Secondary | ICD-10-CM | POA: Insufficient documentation

## 2020-08-31 ENCOUNTER — Emergency Department (INDEPENDENT_AMBULATORY_CARE_PROVIDER_SITE_OTHER): Payer: Medicare Other

## 2020-08-31 ENCOUNTER — Emergency Department (INDEPENDENT_AMBULATORY_CARE_PROVIDER_SITE_OTHER)
Admission: EM | Admit: 2020-08-31 | Discharge: 2020-08-31 | Disposition: A | Discharge status: Home / Self Care | Payer: Medicare Other | Source: Home / Self Care

## 2020-08-31 ENCOUNTER — Encounter: Payer: Self-pay | Admitting: Emergency Medicine

## 2020-08-31 ENCOUNTER — Other Ambulatory Visit: Payer: Self-pay

## 2020-08-31 DIAGNOSIS — M79672 Pain in left foot: Secondary | ICD-10-CM

## 2020-08-31 DIAGNOSIS — S90122A Contusion of left lesser toe(s) without damage to nail, initial encounter: Secondary | ICD-10-CM

## 2020-08-31 DIAGNOSIS — Z90710 Acquired absence of both cervix and uterus: Secondary | ICD-10-CM | POA: Insufficient documentation

## 2020-08-31 DIAGNOSIS — Z87410 Personal history of cervical dysplasia: Secondary | ICD-10-CM | POA: Insufficient documentation

## 2020-08-31 DIAGNOSIS — H409 Unspecified glaucoma: Secondary | ICD-10-CM | POA: Insufficient documentation

## 2020-08-31 DIAGNOSIS — D7589 Other specified diseases of blood and blood-forming organs: Secondary | ICD-10-CM | POA: Insufficient documentation

## 2020-08-31 DIAGNOSIS — Z8619 Personal history of other infectious and parasitic diseases: Secondary | ICD-10-CM | POA: Insufficient documentation

## 2020-08-31 DIAGNOSIS — M79675 Pain in left toe(s): Secondary | ICD-10-CM | POA: Diagnosis not present

## 2020-08-31 NOTE — ED Triage Notes (Signed)
Pt has a hammer toe on left foot (4th one) usually wears a brace, but had taken it off on Friday  and ended up bending the hammer toe  Present w/ continued pain when not wearing hammer toe brace - bruising noted Pt likes to exercise & has not been able to do so  Pfizer booster & flu shot

## 2020-08-31 NOTE — ED Provider Notes (Signed)
[facility]    CSN: [id number] Arrival date & time: 08/31/20  1601      History   Chief Complaint Chief Complaint  Patient presents with  . Toe Injury    left 4th    HPI TIWANA CHAVIS is a 86 y.o. female.   HPI EBBA GOLL is a 86 y.o. female presenting to UC with c/o Left fourth toe pain and bruising that started 3 days ago after accidentally bending her hammer toe.  She usually wears a brace but had taken it off prior to the injury.  Pain is worse when not wearing her brace. She wants to make sure she did not break her toe.     Past Medical History:  Diagnosis Date  . Irritable bowel syndrome with diarrhea   . Osteoarthritis   . PAC (premature atrial contraction)   . Pancytopenia     Patient Active Problem List   Diagnosis Date Noted  . Glaucoma 08/31/2020  . H/O vaginal hysterectomy 08/31/2020  . History of cervical dysplasia 08/31/2020  . Hx of herpes genitalis 08/31/2020  . Macrocytosis 08/31/2020  . Reactive depression (situational) 01/16/2020  . Hypersomnia 01/28/2016  . Dog bite, hand 12/09/2015  . Cellulitis of left hand 12/08/2015  . Macular degeneration 08/14/2014  . Vitamin D deficiency 08/14/2014  . Vitamin B 12 deficiency 08/14/2014  . High risk medication use 04/23/2014  . Trigger ring finger of right hand 07/17/2013  . 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 PAC (premature atrial contraction) 05/30/2012  . Epiretinal membrane 05/04/2012  . History of cataract extraction 05/04/2012  . Nuclear sclerosis 05/04/2012  . Seborrheic dermatitis 04/26/2012  . Chronic neck pain 03/21/2012  . PANCYTOPENIA 12/25/2009  . NEVUS, ATYPICAL 11/10/2009  . DYSFUNCTION OF EUSTACHIAN TUBE 11/10/2009  . ALLERGIC RHINITIS 08/27/2009  . HEARING LOSS 03/19/2009  . 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 HYPERLIPIDEMIA-MIXED 12/04/2008  . Headache(784.0) 12/04/2008  . BACK PAIN, LUMBAR 06/12/2008  . HIP PAIN, LEFT 03/12/2008  . SYNDROME,  CARPAL TUNNEL 02/13/2007  . DEPRESSION, MAJOR, RECURRENT 07/25/2006  . ANXIETY 07/25/2006  . OSTEOPENIA 07/25/2006  . PALPITATIONS 07/25/2006  . History of anxiety 07/25/2006  . History of depression 07/25/2006    Past Surgical History:  Procedure Laterality Date  . ABDOMINAL HYSTERECTOMY    . BLADDER REPAIR    . BREAST SURGERY Right    ductal atresion  . BUNIONECTOMY Left   . CARPAL TUNNEL RELEASE Bilateral   . CATARACT EXTRACTION Bilateral   . INCISION AND DRAINAGE Left    dog bite-finger  . OVARIAN CYST REMOVAL      OB History   No obstetric history on file.      Home Medications    Prior to Admission medications   Medication Sig Start Date End Date Taking? Authorizing Provider  calcium carbonate (CALCIUM 600) 600 MG TABS tablet Take by mouth daily.   Yes [provider]  cholecalciferol (VITAMIN D) 1000 UNITS tablet Take 1,000 Units by mouth daily.   Yes [provider]  clobetasol (TEMOVATE) 0.05 % external solution Apply on the affected area on the scalp Monday-Friday once daily. Avoid the face 02/26/20  Yes [provider]  conjugated estrogens (PREMARIN) vaginal cream Place vaginally. 08/20/20 08/20/21 Yes [provider]  dorzolamide-timolol (COSOPT) 22.3-6.8 MG/ML ophthalmic solution INSTILL 1 DROP INTO LEFT EYE 2 TIMES A DAY 09/05/18  Yes [provider]  latanoprost (XALATAN) 0.005 % ophthalmic solution Place 1 drop into  both eyes as needed.   Yes [provider]  Multiple Vitamins-Minerals (PRESERVISION AREDS 2) CAPS Take by mouth daily.   Yes [provider]  Netarsudil Dimesylate 0.02 % SOLN 1 drop nightly.   Yes [provider]  Probiotic Product (PROBIOTIC-10 PO) Take by mouth daily.   Yes [provider]  tacrolimus (PROTOPIC) 0.1 % ointment Apply on the frontal hairline and eyebrows Saturday and Sunday 02/26/20  Yes [provider]  valACYclovir (VALTREX) 500 MG tablet  as needed. 03/17/14  Yes [provider]  diphenoxylate-atropine (LOMOTIL) 2.5-0.025 MG tablet Take 1 tablet by mouth daily. As needed 10/04/19   Mauri Pole, MD  hydrocortisone 2.5 % ointment as needed. Patient not taking: Reported on 08/31/2020 12/20/17   [provider]  ketoconazole (NIZORAL) 2 % cream as needed. Patient not taking: Reported on 08/31/2020 12/20/17   [provider]  Wheat Dextrin (BENEFIBER PO) Take by mouth 3 (three) times daily.  Patient not taking: Reported on 08/31/2020    [provider]    Family History Family History  Problem Relation Age of Onset  . Hypertension Mother   . Diabetes Brother   . Colon cancer Maternal Uncle   . Esophageal cancer Neg Hx   . Pancreatic cancer Neg Hx   . Stomach cancer Neg Hx   . Liver disease Neg Hx     Social History Social History   Tobacco Use  . Smoking status: Former Research scientist (life sciences)  . Smokeless tobacco: Never Used  . Tobacco comment: quit 60 years ago  Vaping Use  . Vaping Use: Never used  Substance Use Topics  . Alcohol use: Not Currently    Comment: none  . Drug use: No     Allergies   Cyclobenzaprine, Alendronate, Atorvastatin, Oxycodone, Tape, Penicillins, Phenylalanine, and Phenylephrine hcl   Review of Systems Review of Systems  Musculoskeletal: Positive for arthralgias and joint swelling.  Skin: Positive for color change. Negative for wound.     Physical Exam Triage Vital Signs ED Triage Vitals  Enc Vitals Group     BP 08/31/20 1614 (!) 151/68     Pulse Rate 08/31/20 1614 (!) 57     Resp 08/31/20 1614 17     Temp 08/31/20 1614 97.8 F (36.6 C)     Temp Source 08/31/20 1614 Oral     SpO2 08/31/20 1614 99 %     Weight 08/31/20 1618 118 lb (53.5 kg)     Height 08/31/20 1618 5\' 3"  (1.6 m)     Head Circumference --      Peak Flow --      Pain Score 08/31/20 1614 1     Pain Loc --      Pain Edu? --      Excl. in [place]? --    No data found.  Updated Vital  Signs BP (!) 151/68 (BP Location: Right Arm)   Pulse (!) 57   Temp 97.8 F (36.6 C) (Oral)   Resp 17   Ht 5\' 3"  (1.6 m)   Wt 118 lb (53.5 kg)   SpO2 99%   BMI 20.90 kg/m   Visual Acuity Right Eye Distance:   Left Eye Distance:   Bilateral Distance:    Right Eye Near:   Left Eye Near:    Bilateral Near:     Physical Exam Vitals and nursing note reviewed.  Constitutional:      Appearance: She is well-developed.  HENT:  Head: Normocephalic and atraumatic.  Cardiovascular:     Rate and Rhythm: Normal rate.  Pulmonary:     Effort: Pulmonary effort is normal.  Musculoskeletal:        General: Swelling and tenderness present.     Cervical back: Normal range of motion.     Comments: Left foot: deformity of fourth toe- hammer toe. Mild tenderness to distal phalanx. Limited ROM.   Skin:    General: Skin is warm and dry.     Capillary Refill: Capillary refill takes less than 2 seconds.     Comments: Left great toe: skin in tact. Ecchymosis noted.  Neurological:     Mental Status: She is alert and oriented to person, place, and time.  Psychiatric:        Behavior: Behavior normal.      UC Treatments / Results  Labs (all labs ordered are listed, but only abnormal results are displayed) Labs Reviewed - No data to display  EKG   Radiology DG Foot Complete Left  Result Date: 08/31/2020 CLINICAL DATA:  Stubbed left fourth toe on floor 3 days ago. Complains of pain. EXAM: LEFT FOOT - COMPLETE 3+ VIEW COMPARISON:  10/19/2019 FINDINGS: Status post repair of hallux valgus deformity screw fixation of the first metatarsal and proximal phalanx noted. Continued lateral subluxation of the first MTP joint. Moderate first MTP joint osteoarthritis noted. Ankylosis of the second and third PIP joints and second D IP joint noted. Fourth hammertoe deformity identified. No fracture or dislocation identified. IMPRESSION: 1. No acute displaced fractures noted. 2. Chronic lateral subluxation  of the first MTP joint status post hallux valgus repair. 3. 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 Electronically Signed   By: Kerby Moors M.D.   On: 08/31/2020 16:59    Procedures Procedures (including critical care time)  Medications Ordered in UC Medications - No data to display  Initial Impression / Assessment and Plan / UC Course  I have reviewed the triage vital signs and the nursing notes.  Pertinent labs & imaging results that were available during my care of the patient were reviewed by me and considered in my medical decision making (see chart for details).     Discussed imaging with pt Encouraged f/u with PCP or podiatry AVS given  Final Clinical Impressions(s) / UC Diagnoses   Final diagnoses:  Toe pain, left  Contusion of left lesser toe(s) w/o damage to nail, init     Discharge Instructions      You may apply a cool compress and take tylenol as needed for pain.  Call to schedule a follow up appointment with your primary care provider or podiatrist later this week or next week if not improving.    ED Prescriptions    None     PDMP not reviewed this encounter.   Noe Gens, [place] 08/31/20 1712

## 2020-08-31 NOTE — Discharge Instructions (Signed)
  You may apply a cool compress and take tylenol as needed for pain.  Call to schedule a follow up appointment with your primary care provider or podiatrist later this week or next week if not improving.

## 2020-11-04 ENCOUNTER — Other Ambulatory Visit: Payer: Self-pay

## 2020-11-04 ENCOUNTER — Emergency Department (INDEPENDENT_AMBULATORY_CARE_PROVIDER_SITE_OTHER)
Admission: EM | Admit: 2020-11-04 | Discharge: 2020-11-04 | Disposition: A | Discharge status: Home / Self Care | Payer: Medicare Other | Source: Home / Self Care

## 2020-11-04 ENCOUNTER — Emergency Department (INDEPENDENT_AMBULATORY_CARE_PROVIDER_SITE_OTHER): Payer: Medicare Other

## 2020-11-04 DIAGNOSIS — M25511 Pain in right shoulder: Secondary | ICD-10-CM

## 2020-11-04 DIAGNOSIS — S3992XA Unspecified injury of lower back, initial encounter: Secondary | ICD-10-CM

## 2020-11-04 DIAGNOSIS — W009XXA Unspecified fall due to ice and snow, initial encounter: Secondary | ICD-10-CM

## 2020-11-04 DIAGNOSIS — M546 Pain in thoracic spine: Secondary | ICD-10-CM | POA: Diagnosis not present

## 2020-11-04 DIAGNOSIS — S46911A Strain of unspecified muscle, fascia and tendon at shoulder and upper arm level, right arm, initial encounter: Secondary | ICD-10-CM | POA: Diagnosis not present

## 2020-11-04 DIAGNOSIS — W010XXA Fall on same level from slipping, tripping and stumbling without subsequent striking against object, initial encounter: Secondary | ICD-10-CM | POA: Diagnosis not present

## 2020-11-04 DIAGNOSIS — M545 Low back pain, unspecified: Secondary | ICD-10-CM
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
## 2020-11-04 NOTE — Discharge Instructions (Addendum)
Your x-rays are negative of any acute fracture related to fall. For inflammation apply heating pad to back, shoulder, neck or any areas that are achy.  Also resume taking Tylenol 500 mg every 4-6 hours as needed for pain. If pain symptoms do not resolve follow-up with your primary care provider.

## 2020-11-04 NOTE — ED Triage Notes (Signed)
Pt c/o back pain and RT shoulder pain since falling yesterday. Says she landed on back and RT shoulder. Iced and tylenol prn. Last dose 8pm. No previous injuries to back or shoulder. Some bruising on arm. Pain 2/10

## 2020-11-13 ENCOUNTER — Telehealth: Payer: Self-pay

## 2020-11-13 NOTE — Telephone Encounter (Signed)
Pt left VM on nurse line requesting referral for PT of RT shoulder. Pt told to follow up with PCP for re eval if sxs did not improve. Pt acknowledged and will f/u with PCP.

## 2021-04-02 ENCOUNTER — Encounter: Admit: 2021-04-02 | Payer: PRIVATE HEALTH INSURANCE | Attending: Family | Primary: Family

## 2021-04-02 ENCOUNTER — Inpatient Hospital Stay: Admit: 2021-04-02 | Discharge: 2021-04-02 | Payer: MEDICARE | Primary: Family

## 2021-04-02 DIAGNOSIS — D61818 Other pancytopenia: Secondary | ICD-10-CM

## 2021-04-02 LAB — CBC WITH AUTO DIFFERENTIAL
BKR RESP SYNCYTIAL VIRUS ANTIGEN: 0 % (ref 0.0–1.0)
BKR WAM ABSOLUTE IMMATURE GRANULOCYTES.: 0.01 x 1000/??L — ABNORMAL HIGH (ref 0.00–0.30)
BKR WAM ABSOLUTE LYMPHOCYTE COUNT.: 2.32 x 1000/??L (ref 0.60–3.70)
BKR WAM ABSOLUTE NEUTROPHIL COUNT.: 2.22 x 1000/??L (ref 2.00–7.60)
BKR WAM BASOPHIL ABSOLUTE COUNT.: 0.04 x 1000/ÂµL (ref 0.00–1.00)
BKR WAM BASOPHILS: 0.8 % (ref 0.0–1.4)
BKR WAM EOSINOPHIL ABSOLUTE COUNT.: 0.14 x 1000/ÂµL (ref 0.00–1.00)
BKR WAM EOSINOPHILS: 2.7 % (ref 0.0–5.0)
BKR WAM HEMATOCRIT (2 DEC): 38.1 % (ref 35.00–45.00)
BKR WAM HEMOGLOBIN: 12.8 g/dL (ref 11.7–15.5)
BKR WAM IMMATURE GRANULOCYTES: 0.2 % (ref 0.0–1.0)
BKR WAM LYMPHOCYTES: 44.4 % (ref 17.0–50.0)
BKR WAM MCH PG: 33.5 pg — ABNORMAL HIGH (ref 27.0–33.0)
BKR WAM MCHC: 33.6 g/dL (ref 31.0–36.0)
BKR WAM MCV: 99.7 fL (ref 80.0–100.0)
BKR WAM MONOCYTE ABSOLUTE COUNT.: 0.49 x 1000/ÂµL (ref 0.00–1.00)
BKR WAM MONOCYTES: 9.4 % (ref 4.0–12.0)
BKR WAM MPV: 11.2 fL (ref 8.0–12.0)
BKR WAM NEUTROPHILS: 42.5 % (ref 39.0–72.0)
BKR WAM NUCLEATED RED BLOOD CELLS: 0 % (ref 0.0–1.0)
BKR WAM PLATELETS: 191 x1000/??L (ref 150–420)
BKR WAM RDW-CV: 12.7 % (ref 11.0–15.0)
BKR WAM RED BLOOD CELL COUNT.: 0.49 x 1000/??L — ABNORMAL LOW (ref 0.00–1.00)
BKR WAM RED BLOOD CELL COUNT.: 3.82 M/??L — ABNORMAL LOW (ref 4.00–6.00)
BKR WAM WHITE BLOOD CELL COUNT: 5.2 x1000/??L (ref 4.0–11.0)

## 2021-04-02 LAB — LIPID PANEL
BKR CHOLESTEROL: 204 mg/dL — ABNORMAL HIGH
BKR HDL CHOLESTEROL: 107 mg/dL — ABNORMAL HIGH
BKR LDL CHOLESTEROL SAMPSON CALCULATED: 81 mg/dL
BKR TRIGLYCERIDES: 93 mg/dL

## 2021-04-02 LAB — COMPREHENSIVE METABOLIC PANEL
BKR ALANINE AMINOTRANSFERASE (ALT): 21 U/L (ref 12–78)
BKR ALBUMIN: 3.3 g/dL — ABNORMAL LOW (ref 3.4–5.0)
BKR ALKALINE PHOSPHATASE: 93 U/L (ref 45–117)
BKR ANION GAP (LM): 6 mmol/L (ref 5–15)
BKR ASPARTATE AMINOTRANSFERASE (AST): 23 U/L (ref 15–37)
BKR BILIRUBIN TOTAL: 0.5 mg/dL (ref 0.2–1.0)
BKR BLOOD UREA NITROGEN: 24 mg/dL — ABNORMAL HIGH (ref 7–18)
BKR CALCIUM: 8.7 mg/dL (ref 8.5–10.1)
BKR CHLORIDE: 110 mmol/L — ABNORMAL HIGH (ref 98–107)
BKR CO2: 26 mmol/L (ref 21–32)
BKR CREATININE: 0.96 mg/dL (ref 0.55–1.02)
BKR EGFR (AFR AMER) (LMC): >60 mL/min/{1.73_m2} (ref >60)
BKR EGFR (NON AFR AMER) (LMC): 55 mL/min/{1.73_m2} (ref >60)
BKR GLOBULIN: 3.6 g/dL (ref 2.5–5.0)
BKR GLUCOSE: 94 mg/dL (ref 65–110)
BKR POTASSIUM: 4.5 mmol/L (ref 3.5–5.1)
BKR PROTEIN TOTAL: 6.9 g/dL (ref 6.4–8.2)
BKR SODIUM: 142 mmol/L (ref 136–145)

## 2021-04-02 LAB — TSH: BKR THYROID STIMULATING HORMONE (LM): 5.83 uIU/mL — ABNORMAL HIGH (ref 0.36–3.74)

## 2021-04-02 LAB — VITAMIN B12: BKR VITAMIN B12: 1196 pg/mL — ABNORMAL HIGH (ref 211–911)

## 2021-04-05 ENCOUNTER — Encounter: Admit: 2021-04-05 | Payer: PRIVATE HEALTH INSURANCE | Attending: Family | Primary: Family

## 2021-04-05 DIAGNOSIS — M25561 Pain in right knee: Secondary | ICD-10-CM

## 2021-04-06 ENCOUNTER — Inpatient Hospital Stay: Admit: 2021-04-06 | Discharge: 2021-04-06 | Payer: MEDICARE | Primary: Family

## 2021-04-06 DIAGNOSIS — M25561 Pain in right knee: Secondary | ICD-10-CM

## 2021-04-06 LAB — VITAMIN D, 1,25 DIHYDROXY LC/MS/MS
VITAMIN D, 1,25 (OH)2, TOTAL: 22 pg/mL (ref 18–72)
VITAMIN D2, 1,25 (OH)2: <8 pg/mL — ABNORMAL HIGH (ref 211–911)
VITAMIN D3, 1,25 (OH)2: 22 pg/mL

## 2021-08-12 ENCOUNTER — Encounter: Admit: 2021-08-12 | Payer: PRIVATE HEALTH INSURANCE | Attending: Family | Primary: Family

## 2021-08-12 ENCOUNTER — Ambulatory Visit: Admit: 2021-08-12 | Payer: MEDICARE | Attending: Family | Primary: Family

## 2021-08-12 ENCOUNTER — Inpatient Hospital Stay: Admit: 2021-08-12 | Discharge: 2021-08-12 | Payer: MEDICARE | Primary: Family

## 2021-08-12 DIAGNOSIS — R051 Acute cough: Secondary | ICD-10-CM

## 2021-08-12 DIAGNOSIS — B349 Viral infection, unspecified: Secondary | ICD-10-CM

## 2021-08-12 LAB — RESPIRATORY VIRUS PCR PANEL     (BH GH LMW Q YH)
BKR ADENOVIRUS: NOT DETECTED g/dL (ref 31.0–36.0)
BKR B.HOLMESII: NOT DETECTED
BKR B.PARAPERTUSSIS/BRONCHISEPTICA: NOT DETECTED x 1000/??L (ref 0.00–1.00)
BKR HUMAN METAPNEUMOVIRUS (HMPV): NOT DETECTED
BKR INFLUENZA A H1: NOT DETECTED fL (ref 8.0–12.0)
BKR INFLUENZA A H3: NOT DETECTED
BKR INFLUENZA A: NOT DETECTED
BKR INFLUENZA B: NOT DETECTED
BKR PARAINFLUENZA VIRUS 2: NOT DETECTED
BKR PARAINFLUENZA VIRUS 3: NOT DETECTED % (ref 0.0–1.4)
BKR PARAINFLUENZA VIRUS 4: NOT DETECTED U/L (ref 0.0–1.0)
BKR RESPIRATORY SYNCYTIAL VIRUS A: NOT DETECTED
BKR RESPIRATORY SYNCYTIAL VIRUS B: NOT DETECTED

## 2021-08-12 LAB — CBC WITH AUTO DIFFERENTIAL
BKR BORDETELLA PERTUSSIS: 0.03 x 1000/??L (ref 0.00–1.00)
BKR RHINOVIRUS: 1.69 x 1000/??L — ABNORMAL LOW (ref 0.60–3.70)
BKR WAM ABSOLUTE IMMATURE GRANULOCYTES.: 0.04 x 1000/??L (ref 0.00–0.30)
BKR WAM ABSOLUTE LYMPHOCYTE COUNT.: 1.69 x 1000/ÂµL (ref 0.60–3.70)
BKR WAM ABSOLUTE NEUTROPHIL COUNT.: 5.07 x 1000/??L (ref 2.00–7.60)
BKR WAM BASOPHIL ABSOLUTE COUNT.: 0.03 x 1000/ÂµL (ref 0.00–1.00)
BKR WAM BASOPHILS: 0.4 % (ref 0.0–1.4)
BKR WAM EOSINOPHIL ABSOLUTE COUNT.: 0.19 x 1000/ÂµL (ref 0.00–1.00)
BKR WAM EOSINOPHILS: 2.3 % (ref 0.0–5.0)
BKR WAM HEMATOCRIT (2 DEC): 38.9 % (ref 35.00–45.00)
BKR WAM HEMOGLOBIN: 12.1 g/dL (ref 11.7–15.5)
BKR WAM IMMATURE GRANULOCYTES: 0.5 % (ref 0.0–1.0)
BKR WAM LYMPHOCYTES: 20.5 % (ref 6.4–8.2)
BKR WAM MCH PG: 32.2 pg (ref 27.0–33.0)
BKR WAM MCHC: 31.1 g/dL (ref 31.0–36.0)
BKR WAM MCV: 103.5 fL — ABNORMAL HIGH (ref 80.0–100.0)
BKR WAM MONOCYTE ABSOLUTE COUNT.: 1.22 x 1000/??L — ABNORMAL HIGH (ref 0.00–1.00)
BKR WAM MONOCYTES: 14.8 % — ABNORMAL HIGH (ref 4.0–12.0)
BKR WAM MPV: 10.6 fL (ref 8.0–12.0)
BKR WAM NEUTROPHILS: 61.5 % (ref 39.0–72.0)
BKR WAM NUCLEATED RED BLOOD CELLS: 0 % (ref 0.0–1.0)
BKR WAM PLATELETS: 172 x1000/??L (ref 150–420)
BKR WAM RDW-CV: 12.5 % (ref 11.0–15.0)
BKR WAM WHITE BLOOD CELL COUNT: 8.2 x1000/ÂµL (ref 4.0–11.0)

## 2021-08-12 LAB — COMPREHENSIVE METABOLIC PANEL
BKR ALANINE AMINOTRANSFERASE (ALT): 18 U/L (ref 12–78)
BKR ALBUMIN: 3.3 g/dL — ABNORMAL LOW (ref 3.4–5.0)
BKR ALKALINE PHOSPHATASE: 108 U/L (ref 45–117)
BKR ANION GAP (LM): 8 mmol/L (ref 5–15)
BKR ASPARTATE AMINOTRANSFERASE (AST): 19 U/L (ref 15–37)
BKR BILIRUBIN TOTAL: 0.6 mg/dL (ref 0.2–1.0)
BKR BLOOD UREA NITROGEN: 15 mg/dL (ref 7–18)
BKR CALCIUM: 9.5 mg/dL (ref 8.5–10.1)
BKR CHLORIDE: 107 mmol/L (ref 98–107)
BKR CO2: 27 mmol/L (ref 21–32)
BKR CREATININE: 1.02 mg/dL (ref 0.55–1.02)
BKR EGFR, CREATININE (CKD-EPI 2021): 53 mL/min/{1.73_m2} — ABNORMAL LOW (ref >=60)
BKR GLOBULIN: 4.2 g/dL (ref 2.5–5.0)
BKR GLUCOSE: 116 mg/dL — ABNORMAL HIGH (ref 65–110)
BKR PARAINFLUENZA VIRUS 1: 3.3 g/dL — ABNORMAL LOW (ref 3.4–5.0)
BKR POTASSIUM: 4.7 mmol/L (ref 3.5–5.1)
BKR PROTEIN TOTAL: 7.5 g/dL (ref 6.4–8.2)
BKR SODIUM: 142 mmol/L (ref 136–145)

## 2021-09-22 ENCOUNTER — Inpatient Hospital Stay: Admit: 2021-09-22 | Discharge: 2021-09-22 | Payer: MEDICARE | Attending: Emergency Medicine

## 2021-09-22 ENCOUNTER — Emergency Department: Admit: 2021-09-22 | Payer: MEDICARE | Primary: Family

## 2021-09-22 ENCOUNTER — Encounter: Admit: 2021-09-22 | Payer: PRIVATE HEALTH INSURANCE | Primary: Family

## 2021-09-22 DIAGNOSIS — S0081XA Abrasion of other part of head, initial encounter: Principal | ICD-10-CM

## 2021-09-22 DIAGNOSIS — M25512 Pain in left shoulder: Secondary | ICD-10-CM

## 2021-09-22 DIAGNOSIS — S0031XA Abrasion of nose, initial encounter: Secondary | ICD-10-CM

## 2021-09-22 DIAGNOSIS — S60512A Abrasion of left hand, initial encounter: Secondary | ICD-10-CM

## 2021-09-22 NOTE — Discharge Instructions
You were evaluated after trip and fall.  X-ray did not show sign of broken bone in the shoulder.  [place] head did not show sign of bleeding in the brain.  Can do the topical antibiotic ointment for the abrasions.  Monitor for signs of concussion headache dizziness fatigue blurred vision nausea if these develop please follow-up with your primary provider or return to the ER for re-evaluation.

## 2021-09-22 NOTE — ED Provider Notes
HistoryChief Complaint Patient presents with ? Fall   Pt. Fell onto the pavement, injured face, no LOC.Bruised forehead and nose, abrasion to left hand. Up to date with tetnus.  HPI 87 year old generally healthy female here with complaint of head trauma.  Patient states she was walking her dog and holding an umbrella and the dog polar in 1 direction and she slipped hitting her forehead.  No loss of consciousness was able to get up on her own.  Has a slight abrasion to her left hand and over her nose.  Also complaining of some left shoulder pain.  No loss of consciousness no blood thinners no headache neck pain back pain chest pain shortness of breath vision changes focal numbness weakness however given the significant head trauma patient wanted to be evaluated for possible head bleeding.  States tetanus is up-to-date.  No recent illness.  Lives independently in his very active was on her way to go exercise at the gym. No past medical history on file.Past Surgical History: Procedure Laterality Date ? CATARACT EXTRACTION   ? HYSTERECTOMY   No family history on file.Social History Socioeconomic History ? Marital status: Single ED Other Social History E-cigarette/Vaping Substances E-cigarette/Vaping Devices Review of Systems All other systems reviewed and are negative.BP (!) 144/81  - Pulse 67  - Temp 98.2 ?F (36.8 ?C)  - Resp 20  - Ht 5' 3 (1.6 m)  - Wt 54.4 kg  - SpO2 98%  - BMI 21.26 kg/m? Physical ExamVitals and nursing note reviewed. Constitutional:     General: She is not in acute distress.   Appearance: She is not toxic-appearing. HENT:    Head:    Comments: Small contusion over forehead abrasion over the bridge of the nose   Nose:    Comments: Patient over the bridge of the nose no swelling or deformity no epistaxis   Mouth/Throat:    Comments: Normal dental occlusion no facial swelling or tenderness no ecchymosisEyes:    General: No scleral icterus.   Conjunctiva/sclera: Conjunctivae normal. Cardiovascular:    Rate and Rhythm: Normal rate and regular rhythm.    Pulses: Normal pulses. Pulmonary:    Effort: Pulmonary effort is normal.    Breath sounds: Normal breath sounds. Chest:    Chest wall: No tenderness. Abdominal:    General: There is no distension.    Tenderness: There is no abdominal tenderness. Musculoskeletal:    Cervical back: Normal range of motion. No tenderness.    Comments: Abrasion over left hand no swelling or deformity normal cap refill sensation normal inspection otherwise of extremities without swelling or deformity no spinal tenderness Skin:   General: Skin is warm. Neurological:    General: No focal deficit present.    Mental Status: She is alert and oriented to person, place, and time.    Cranial Nerves: No cranial nerve deficit.    Sensory: No sensory deficit.    Motor: No weakness.    Coordination: Coordination normal.    Gait: Gait normal. Psychiatric:       Mood and Affect: Mood normal.  ProceduresProcedures ED CourseClinical Impressions as of 09/22/21 1329 Abrasion of nose, initial encounter Abrasion of left hand, initial encounter Acute shoulder pain due to trauma, left Fall, initial encounter [id number] year old generally healthy female here with complaint of head trauma.  Appears to be mechanical trip and fall likely multifactorial currently raining outside patient dog pulled her in 1 direction.  She is not on any blood thinners but did have significant head trauma  given patient age did proceed with [place] of the head which not show sign of hemorrhage.  She is no complaint of neck pain or headache normal range of motion the neck no midline tenderness no focal deficits.  No other trauma reported had from some left shoulder pain x-ray obtained no sign of fracture she is able to move her arm without limitation.  Tetanus is up-to-date.  Patient ambulates at a brisk pace is very active functional for age.  Recommend rest couple days monitor for signs of concussion follow-up with primary care provider discussed return precautions the ED patient voiced understanding agreement plan discharged well-appearing no acute distress. ED DispositionDischarge Arnell Sieving, MD12/07/22 1330

## 2021-10-06 ENCOUNTER — Inpatient Hospital Stay: Admit: 2021-10-06 | Discharge: 2021-10-06 | Payer: MEDICARE | Primary: Family

## 2021-10-06 ENCOUNTER — Ambulatory Visit: Admit: 2021-10-06 | Payer: MEDICARE | Attending: Obstetrics and Gynecology | Primary: Family

## 2021-10-06 DIAGNOSIS — Z1151 Encounter for screening for human papillomavirus (HPV): Secondary | ICD-10-CM

## 2021-10-06 DIAGNOSIS — Z1272 Encounter for screening for malignant neoplasm of vagina: Secondary | ICD-10-CM

## 2021-10-06 MED ORDER — LATANOPROST 0.005 % EYE DROPS
0.005 | Freq: Every day | OPHTHALMIC | Status: SS
Start: 2021-10-06 — End: 2022-06-10

## 2021-10-06 MED ORDER — VIT C 226 MG-VIT E 90 MG-COPPER 0.8 MG-ZINC OXIDE-LUTEIN 5 MG CAPSULE
226-90-0.8-5 | Freq: Two times a day (BID) | ORAL | Status: AC
Start: 2021-10-06 — End: ?

## 2021-10-06 MED ORDER — CALCIUM 600 MG (AS CALCIUM CARBONATE 1,500 MG) TABLET
1500 | Freq: Every day | ORAL | 0.00 refills | 30.00000 days | Status: AC
Start: 2021-10-06 — End: ?

## 2021-10-06 MED ORDER — DORZOLAMIDE 22.3 MG-TIMOLOL 6.8 MG/ML EYE DROPS
22.3-6.8 | Freq: Two times a day (BID) | OPHTHALMIC | 0.00 refills | 50.00000 days | Status: AC
Start: 2021-10-06 — End: ?

## 2021-10-06 MED ORDER — KETOCONAZOLE 2 % TOPICAL CREAM
2 | Freq: Every day | TOPICAL | 3.00 refills | 30.00000 days | Status: AC | PRN
Start: 2021-10-06 — End: ?

## 2021-10-06 MED ORDER — AZELASTINE 137 MCG (0.1 %) NASAL SPRAY
137 | Status: SS
Start: 2021-10-06 — End: 2022-06-10

## 2021-10-06 MED ORDER — VALACYCLOVIR 500 MG TABLET
500 | Status: SS
Start: 2021-10-06 — End: 2022-06-10

## 2021-10-06 MED ORDER — BRIMONIDINE 0.2 % EYE DROPS
0.2 | Freq: Two times a day (BID) | OPHTHALMIC | 0.00 refills | 30.00000 days | Status: AC
Start: 2021-10-06 — End: ?

## 2021-10-06 MED ORDER — NETARSUDIL 0.02 % EYE DROPS
0.02 | Status: SS
Start: 2021-10-06 — End: 2022-06-10

## 2021-10-06 MED ORDER — CHOLECALCIFEROL (VITAMIN D3) 25 MCG (1,000 UNIT) TABLET
25 | Freq: Every day | ORAL | 0.00 refills | 84.00000 days | Status: AC
Start: 2021-10-06 — End: ?

## 2021-10-07 ENCOUNTER — Encounter: Admit: 2021-10-07 | Payer: PRIVATE HEALTH INSURANCE | Attending: Obstetrics and Gynecology | Primary: Family

## 2021-10-07 DIAGNOSIS — Z1272 Encounter for screening for malignant neoplasm of vagina: Secondary | ICD-10-CM

## 2021-10-07 DIAGNOSIS — Z1151 Encounter for screening for human papillomavirus (HPV): Secondary | ICD-10-CM

## 2021-10-13 LAB — HPV MRNA E6/E7, POST HYSTERECTOMY, VAGINAL (Q WH): HPV MRNA E6/E7, VAGINAL: NOT DETECTED

## 2021-10-18 ENCOUNTER — Encounter: Admit: 2021-10-18 | Payer: PRIVATE HEALTH INSURANCE | Attending: Obstetrics and Gynecology | Primary: Family

## 2021-10-18 ENCOUNTER — Encounter: Admit: 2021-10-18 | Payer: PRIVATE HEALTH INSURANCE | Primary: Family

## 2021-10-18 ENCOUNTER — Inpatient Hospital Stay: Admit: 2021-10-18 | Discharge: 2021-10-18 | Payer: MEDICARE | Primary: Family

## 2021-10-18 ENCOUNTER — Ambulatory Visit: Admit: 2021-10-18 | Payer: MEDICARE | Primary: Family

## 2021-10-18 DIAGNOSIS — Z1231 Encounter for screening mammogram for malignant neoplasm of breast: Secondary | ICD-10-CM

## 2021-10-18 DIAGNOSIS — Z8041 Family history of malignant neoplasm of ovary: Secondary | ICD-10-CM

## 2021-10-18 DIAGNOSIS — R87619 Unspecified abnormal cytological findings in specimens from cervix uteri: Secondary | ICD-10-CM

## 2021-11-19 ENCOUNTER — Inpatient Hospital Stay: Admit: 2021-11-19 | Discharge: 2021-11-19 | Payer: MEDICARE | Primary: Family

## 2021-11-19 ENCOUNTER — Encounter: Admit: 2021-11-19 | Payer: PRIVATE HEALTH INSURANCE | Attending: Obstetrics and Gynecology | Primary: Family

## 2022-01-21 ENCOUNTER — Emergency Department: Admit: 2022-01-21 | Payer: MEDICARE | Primary: Family

## 2022-01-21 ENCOUNTER — Inpatient Hospital Stay: Admit: 2022-01-21 | Discharge: 2022-01-21 | Payer: MEDICARE

## 2022-01-21 DIAGNOSIS — S61257D Open bite of left little finger without damage to nail, subsequent encounter: Secondary | ICD-10-CM

## 2022-01-21 NOTE — ED Notes
10:48 AM xray called for pt

## 2022-01-21 NOTE — ED Provider Notes
Chief Complaint Patient presents with ? Animal Bite   Pt bit by dog in right hand; dog utd on vaccines; pt utd on tetanus; referred by pcp for xray and possible stitches Medical Decision Making88 year old female female arrives via private vehicle for dog bite to her right hand.  Patient states the dog bite occurred last night around 11 pm.  She states her dog which is a Dotson, was sitting on her lap.  When the phone rang she went to grab the phone, and her dog bit her hand.  States the dog's immunizations are up-to-date.  She states her tetanus shot was up-to-date, was given 2 years ago.  She saw her primary care doctor prior to arrival who already ordered antibiotics.  She referred her to the emergency department for x-ray and possible closure.  Denies any other injuries other than her right hand.Amount and/or Complexity of Data ReviewedRadiology: ordered.  Physical ExamED Triage Vitals [01/21/22 1045]BP: (!) 171/72Pulse: 68Pulse from  O2 sat: n/aResp: 18Temp: 97 ?F (36.1 ?C)Temp src: n/aSpO2: 99 % BP (!) 171/72  - Pulse 68  - Temp 97 ?F (36.1 ?C)  - Resp 18  - Wt 56.6 kg  - SpO2 99%  - BMI 22.10 kg/m? Physical ExamVitals and nursing note reviewed. Constitutional:     Appearance: Normal appearance. She is normal weight. HENT:    Head: Normocephalic and atraumatic.    Nose: Nose normal. Eyes:    Extraocular Movements: Extraocular movements intact.    Conjunctiva/sclera: Conjunctivae normal. Cardiovascular:    Rate and Rhythm: Normal rate and regular rhythm.    Pulses: Normal pulses.    Heart sounds: Normal heart sounds. Pulmonary:    Effort: Pulmonary effort is normal.    Breath sounds: Normal breath sounds. Abdominal:    General: Abdomen is flat. Bowel sounds are normal.    Palpations: Abdomen is soft. Musculoskeletal:       General: Normal range of motion.    Right hand: Swelling, tenderness and bony tenderness present. No deformity. Normal range of motion. Normal strength. Normal sensation. There is no disruption of two-point discrimination. Normal capillary refill. Normal pulse.      Hands:   Cervical back: Normal range of motion and neck supple. Skin:   General: Skin is warm. Neurological:    General: No focal deficit present.    Mental Status: She is alert. Psychiatric:       Mood and Affect: Mood normal.  ProceduresAttestation/Critical CarePatient Reevaluation: 88 year old female arrives emergency department for dog bite to her right hand.  This injury occurred last evening around 11:00 p.m.Marland Kitchen  Patient did see her primary care doctor prior to arrival who placed her on antibiotics.  She states they are currently at the pharmacy.  She was advised to come to the emergency department for an x-ray of her hand and possibly closure of the wound.  Patient has a large skin avulsion of her left small finger, avulsion is too thin to repair.  Wound was extensively cleaned, and Steri-Strips were applied for closure.  Patient was given instructions on how to do wound care, she verbalized understanding.  Does state her primary care doctor is following her up on Monday for recheck.  Currently no signs of infection.  Police were notified by RN of dog bite.  Patient's tetanus is up-to-date.  Patient has full range of motion of hand.Hand 3V RightResult Date: 4/7/2023XR HAND RIGHT PA LATERAL AND OBLIQUE HISTORY: dog bite. COMPARISON: None. TECHNIQUE:  3 views FINDINGS:  There is severe  degenerative arthritis in the first carpometacarpal joint as well as in the second and third distal interphalangeal joints and the fifth proximal and distal interphalangeal joint. More mild degenerative arthritis is present in the other interphalangeal joints and in the first metacarpophalangeal joint. There is no acute fracture. There is no radiopaque foreign body. There is no radiographic evidence of osteomyelitis or significant soft tissue abnormality. 1. Multifocal degenerative arthritis as described above. No acute abnormality. [facility] Radiology Notification System Classification: Routine Reported and Signed by:  Alinda Money, MD Supervising physician was available for consult.  Patient independently examined by mePatient progress: improvedClinical Impressions as of 01/21/22 1139 Open wound of right hand due to dog bite  ED DispositionDischarge Corie Chiquito, APRN04/07/23 1554

## 2022-01-21 NOTE — Discharge Instructions
Make sure you start the antibiotics you were given by your doctor today.  Monitor your wounds for any sign of infection.  Return if you develop a fever, redness or drainage

## 2022-01-30 ENCOUNTER — Inpatient Hospital Stay: Admit: 2022-01-30 | Discharge: 2022-01-30 | Payer: MEDICARE

## 2022-01-30 NOTE — ED Provider Notes
Chief Complaint Patient presents with ? Wound Check Vitals:  01/30/22 1250 BP: 130/80 Pulse: 65 Resp: 16 Temp: 98.5 ?F (36.9 ?C) TempSrc: Tympanic SpO2: 98% Medical Decision Sarah Montes is an 88 yo female who presents for wound check. She was bitten by her dog 5 days ago and removed the steri-strips today. She is wondering how she should care for the wound. She completed abx. No concern for infection. On exam there is skin tear like wound on the right small finger that is healing well with healthy granulation tissue at the base. No edema. No surrounding erythema. No discharge. Wound cleaned with sterile water and Hibiclens. Re-dressed. Wound care discussed. ED COURSEClinical Impressions as of 01/30/22 1326 Visit for wound check Attestation/Critical CareROSPositives and pertinent negatives were reviewed in the MDM. All other systems were reviewed and are otherwise negative.LABS/IMAGINGI have reviewed all available labs for this visit prior to discharge.No results found for this visit on 01/30/22.DISPOSITIONBarbara ALIESHA Montes was discharged to home in stable condition.SupervisionI have evaluated this patient; My supervising physician was available for consultation.DISCLAIMER: This chart was created using M-Modal dictation software. Efforts were made by me to ensure accuracy, however some errors may be present due to limitations of this technology and occasionally words are not transcribed as intended. Damien Fusi, PA04/16/23 1326

## 2022-01-30 NOTE — Discharge Instructions
Keep the wound dry. Remove the steri strips in 5 days. After steri strips are removed keep open to air. Return for fevers, chills, redness/swelling.

## 2022-01-30 NOTE — ED Notes
Pt presenting to ED for a wound check. Reports she was bitten by a dog aprox 1.5 weeks ago. Pt had steri strips placed. Pt reports she removed steri strips this morning and replaced them today as well. Pt reports, 'Im not sure if theres anything else I should be doing for it. mild swelling noted to site. +CMS noted in RUE.

## 2022-04-03 ENCOUNTER — Inpatient Hospital Stay: Admit: 2022-04-03 | Discharge: 2022-04-03 | Payer: MEDICARE | Attending: Emergency Medicine

## 2022-04-03 ENCOUNTER — Emergency Department: Admit: 2022-04-03 | Payer: MEDICARE | Primary: Family

## 2022-04-03 DIAGNOSIS — M79651 Pain in right thigh: Secondary | ICD-10-CM

## 2022-04-03 DIAGNOSIS — S76311A Strain of muscle, fascia and tendon of the posterior muscle group at thigh level, right thigh, initial encounter: Secondary | ICD-10-CM

## 2022-04-03 MED ORDER — NAPROXEN 375 MG TABLET
375 mg | ORAL_TABLET | Freq: Two times a day (BID) | ORAL | 1 refills | Status: SS
Start: 2022-04-03 — End: 2022-06-10

## 2022-04-03 NOTE — ED Notes
2:56 PM Patient to xray at this time.

## 2022-04-10 NOTE — ED Provider Notes
VWU:JWJX is an 88 year old female patient with a chief complaint of hamstring tightness and tenderness.  It occurred at 1st when she was turning her right toe out and pivoting at the grocery store she felt a pain in the back of her leg and 2 weeks later she is still experiencing that same pain she has no loss of bowel bladder control no saddle anesthesia she has moderate low back pain as well this is thought to be separate then her leg issue she has complaints of back pain chronically the leg pain is new and it occurred when she was pivoting and is reproducible right posterior upper leg.ROS:  10 systems reviewed pertinent positives and negatives dictated in the HPI all other systems are negativePast Medical History:  Past medical history, surgical history, family history, current medications, allergies, social history reviewed by myself in nursing notes, see nursing notes for further informationVitals:  BP (!) 142/70  - Pulse 62  - Temp 98.2 ?F (36.8 ?C) (Temporal)  - Resp 18  - Wt 55.2 kg  - SpO2 98%  - BMI 21.56 kg/m? Physical exam:Constitutional:  Alert no apparent distresshead:  Normocephalic and nontraumaticeyes:  No proptosis extraocular movements are intactENT:  External inspection of ears and nose reveal no acute abnormalityneck:  Trachea is midlineheart:  Regular rate and rhythm no murmurs rubs or gallopsLungs:  Clear to auscultation bilaterally with no wheeze rubs or rhonchiAbdomen:  Soft nontender nondistended positive bowel sounds throughoutExtremities:  Moves all extremities spontaneously tenderness palpation over right hamstring to palpation straight leg raising negativeSkin:  No acute lesions or  rashesNeuro:  Alert with no acute focal neurologic deficitED course:Patient's straight leg raising test is negative but does not elicit pain down the leg there is tenderness to palpation over the right posterior hamstring she is thought to have a pulled muscle in her leg and not sciatica.  X-rays of the femur prove no bony abnormality.  Patient will be prescribed a mild analgesics discharged home with instructions to follow up with primary careImpression 1.  Pulled muscle right hamstring Disposition home  Josph Macho, DO06/25/23 [phone]

## 2022-05-18 IMAGING — DX DG LUMBAR SPINE COMPLETE 4+V
5 series · 5 of 5 positions shown · non-contrast
Comparison: None.

CLINICAL DATA: Fall.  Back pain

EXAM:
LUMBAR SPINE - COMPLETE 4+ VIEW

[l-spine ap]
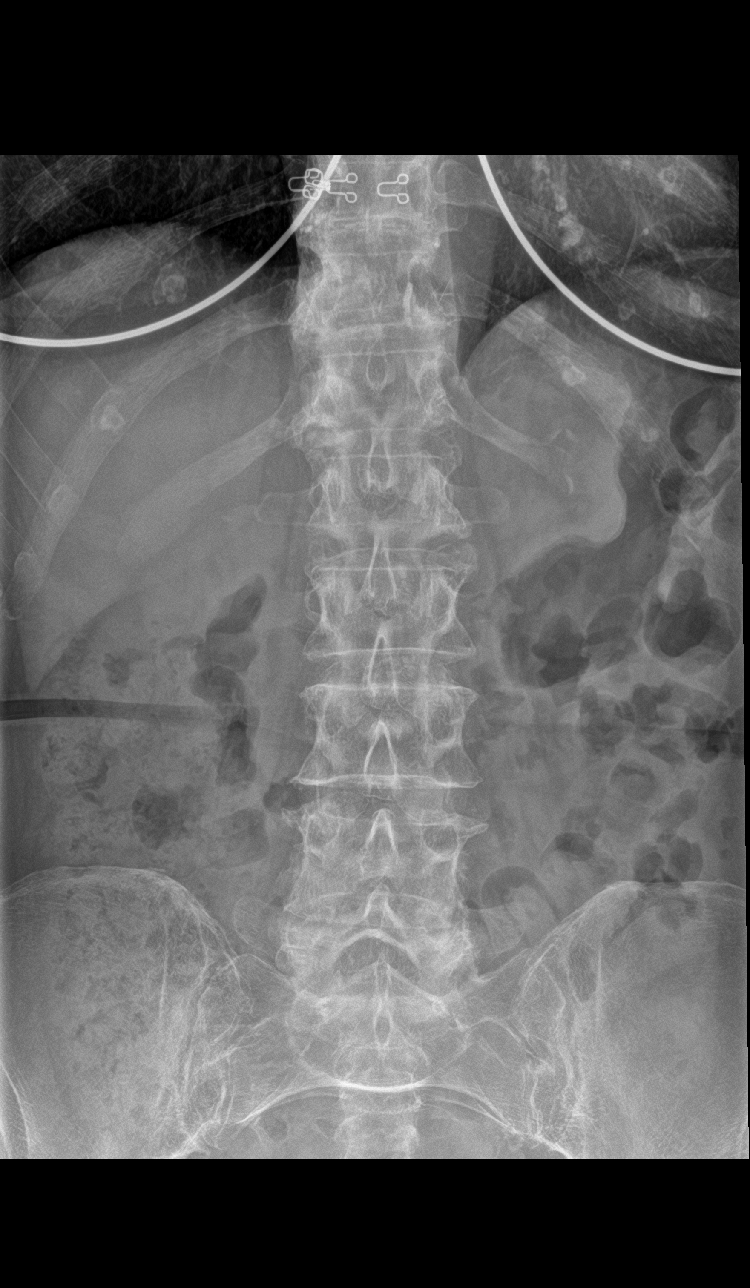

[l-spine obl (1 of 2)]
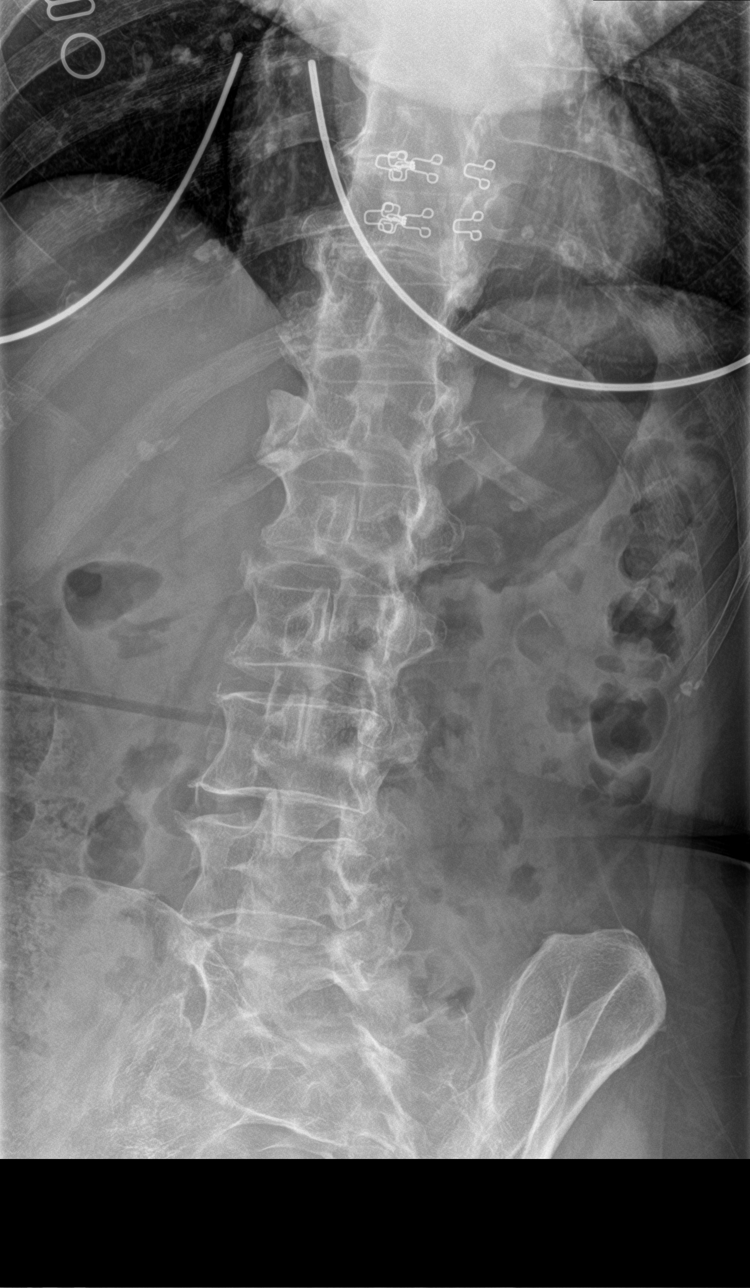

[l-spine obl (2 of 2)]
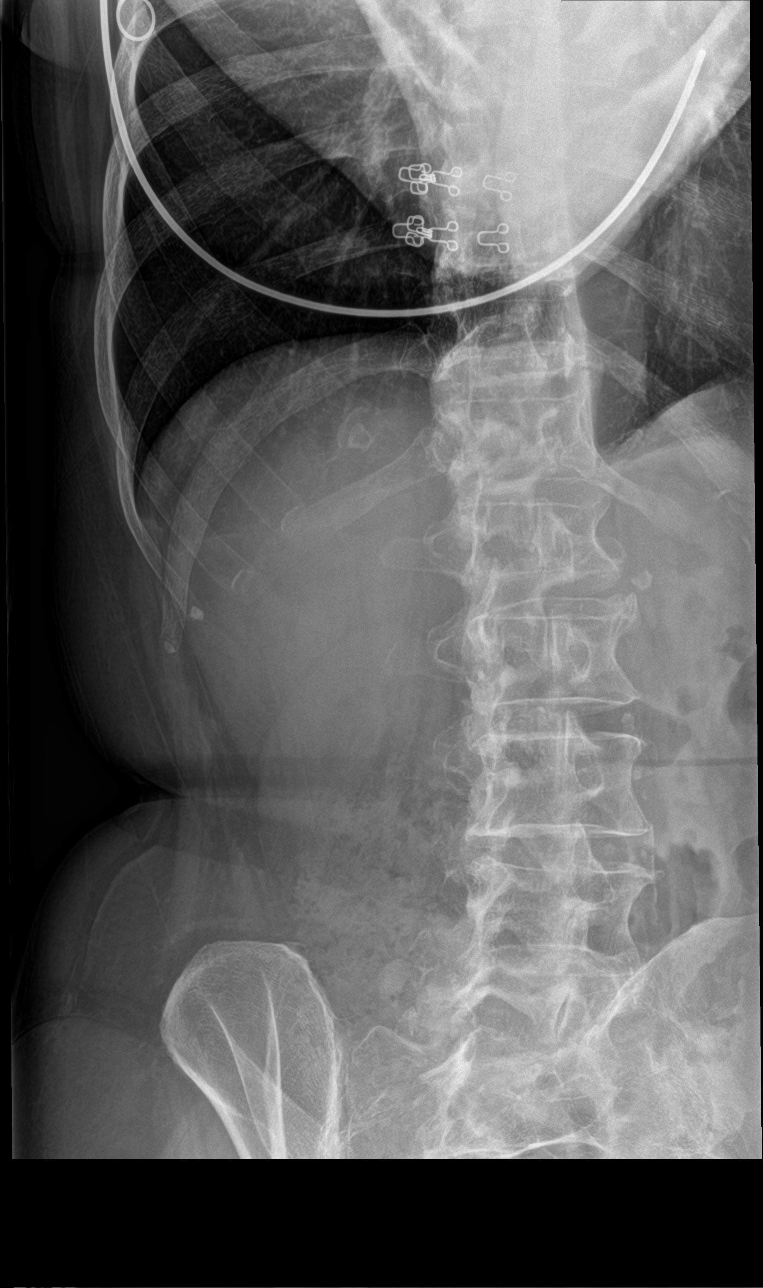

[l-spine lat]
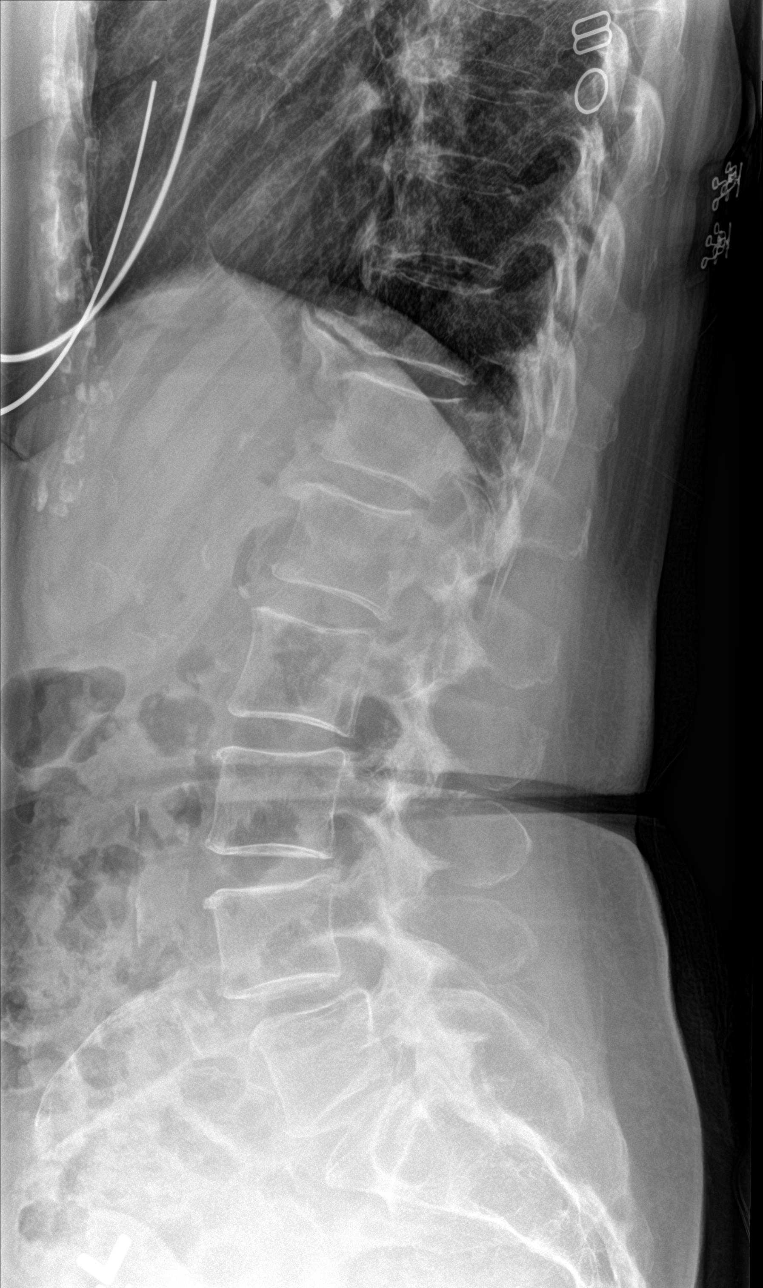

[l-spine spot]
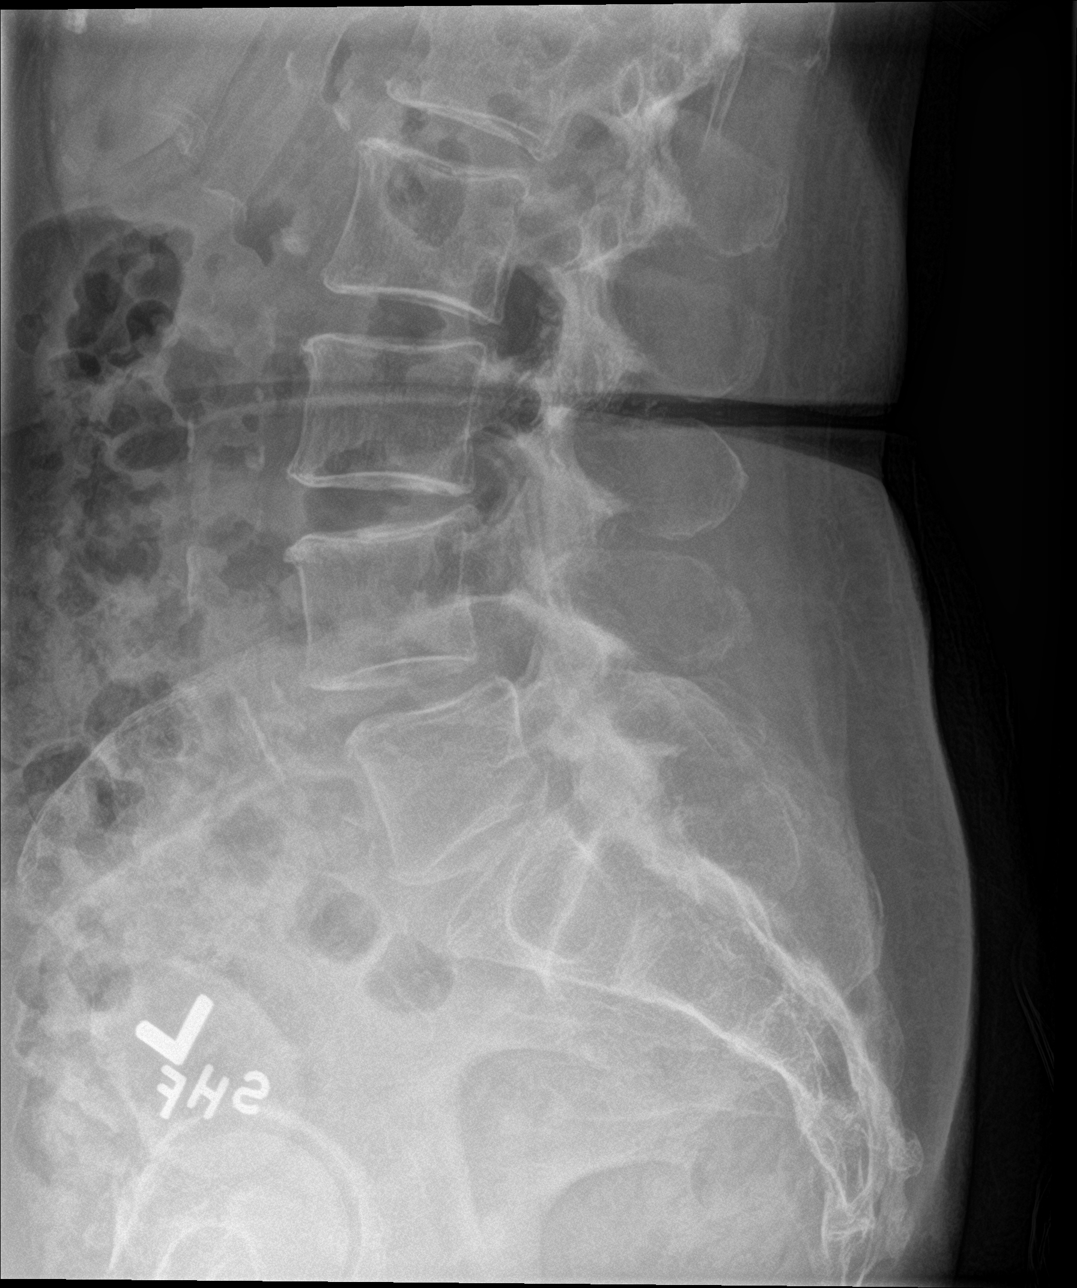

[5 of 5 positions shown; findings below may reference images not displayed]

FINDINGS: Grade 1 anterolisthesis L4-5. Negative for lumbar fracture. Mild
disc space narrowing L4-5. Remaining disc spaces normal.
IMPRESSION: Negative for fracture.

## 2022-06-07 ENCOUNTER — Encounter: Admit: 2022-06-07 | Payer: PRIVATE HEALTH INSURANCE | Primary: Family

## 2022-06-07 ENCOUNTER — Emergency Department: Admit: 2022-06-07 | Payer: MEDICARE | Primary: Family

## 2022-06-07 ENCOUNTER — Inpatient Hospital Stay: Admit: 2022-06-07 | Discharge: 2022-06-07 | Payer: MEDICARE | Primary: Family

## 2022-06-07 DIAGNOSIS — M48061 Spinal stenosis, lumbar region without neurogenic claudication: Secondary | ICD-10-CM

## 2022-06-07 DIAGNOSIS — M5126 Other intervertebral disc displacement, lumbar region: Secondary | ICD-10-CM

## 2022-06-07 DIAGNOSIS — H3530 Unspecified macular degeneration: Secondary | ICD-10-CM

## 2022-06-07 DIAGNOSIS — R87619 Unspecified abnormal cytological findings in specimens from cervix uteri: Secondary | ICD-10-CM

## 2022-06-07 DIAGNOSIS — Z880 Allergy status to penicillin: Secondary | ICD-10-CM

## 2022-06-07 DIAGNOSIS — Z79899 Other long term (current) drug therapy: Secondary | ICD-10-CM

## 2022-06-07 DIAGNOSIS — Z885 Allergy status to narcotic agent status: Secondary | ICD-10-CM

## 2022-06-07 DIAGNOSIS — H409 Unspecified glaucoma: Secondary | ICD-10-CM

## 2022-06-07 DIAGNOSIS — Z791 Long term (current) use of non-steroidal anti-inflammatories (NSAID): Secondary | ICD-10-CM

## 2022-06-07 DIAGNOSIS — E785 Hyperlipidemia, unspecified: Secondary | ICD-10-CM

## 2022-06-07 DIAGNOSIS — Z888 Allergy status to other drugs, medicaments and biological substances status: Secondary | ICD-10-CM

## 2022-06-07 DIAGNOSIS — M549 Dorsalgia, unspecified: Secondary | ICD-10-CM

## 2022-06-07 MED ORDER — TRAMADOL (ULTRAM) 25 MG HALFTAB
25 mg | Freq: Once | ORAL | Status: CP
Start: 2022-06-07 — End: ?
  Administered 2022-06-07: 19:00:00 25 mg via ORAL

## 2022-06-07 MED ORDER — TRAMADOL 50 MG TABLET
50 mg | ORAL_TABLET | Freq: Four times a day (QID) | ORAL | 1 refills | Status: AC | PRN
Start: 2022-06-07 — End: 2022-06-12

## 2022-06-07 NOTE — ED Notes
2:34 PM [facility] CALLED FOR PT

## 2022-06-07 NOTE — ED Notes
3:38 PM PROVIDER AT BEDSIDE

## 2022-06-07 NOTE — ED Provider Notes
Chief Complaint Patient presents with ? Back Pain   Pt started with back pain gradually but 2 weeks ago it came in full force lower left back pt called pcp and was put on prednisone, tramadol and cyclobenzaprine but it got better but then yesterday it came back and is severe. pt took a gabapentin yesterday and advil today along with a tramadol. Pt also reports a stiff neck and also reports pain in her right hand put spling back on 2 weeks ago but still bothering her HPI/PE:Sarah Montes is an 88 y.o. female with no pertinent PMH who presents to the ED c/o back pain x 2 weeks.  Patient reports that several weeks ago she had some soreness which occurred in the morning and resolved with activity. Approximately 2 weeks ago she noticed an increased in pain causing her to see her PMD. She was tx with a prednisone taper and has been taking advil. Pain resolved last week. She saw her PMD on 06/02/22 and was pain free. Patient reports the pain returned on 8/19 and was severe. She has tried gabapentin and flexeril once without any improvement in her pain. This am she took ultram which did relieve her pain but it has since worn off. She denies any fever, numbness/tingling in the groin/rectum, loss of bowel/bladder control. Physical Exam:GENERAL APPEARANCE: A 88 y.o. female in no acute distress, non-toxic, non-hypoxic in appearance.HEENT: Head normocephalic, atraumatic. Normal external inspection. PULMONARY: Pt in no respiratory distress. Lungs are clear to ascultation.CARDIOVASCULAR: Regular rate and rhythm. No murmurs, rubs, or gallops.ABDOMEN: Soft, nondistended. Nontender to palpation. BACK: No outward signs of trauma. +midline TTP of the low back.EXTREMITIES: Atraumatic x4, moving all extremities. No evidence of cyanosis or edema.SKIN: Warm, dry without rash, ecchymosis or erythema.NEUROLOGIC: Alert and oriented. No facial asymmetry noted. No focal deficits. Dorsiflexion and plantar flexion, patellar DTR, 1st webspace and b/l upper medial inner thigh sensation to touch and posterior tibialis pulses present and symmetric b/l. PSYCHIATRIC: Calm and pleasant affect, cooperative and interactive with staff.ZHY:QMVHQIO'N vital signs are stable. There is no evidence of cauda equina by history or physical exam.  There is midline tenderness to palpation to warrant imaging.  Will obtain a [place] scan of the patient's lumbar spine. Patient tx with ultram 25 mg PO.[place] Lumbar Spine wo IV Contrast (Final result)Result time 06/07/22 15:27:07Final result by Harle Stanford, MD (06/07/22 15:27:07)Impression:1. Moderate lumbar spondylosis notably L4-L5 level demonstrating moderate severe spinal stenosis. Impingement upon nerve roots within lateral recesses cannot be excluded. Further assessment with MRI recommended. 2. Additional findings as noted. Results reviewed with patient and her son at bedside.  Patient reports improvement in her pain with tramadol. Patient does not wish to wait for ambulatory trial with cane.  Patient requesting to be discharged home.  Patient to follow-up with both her primary care doctor and orthopedic spine specialist which I will give her contact info for.  Patient to return to the ER for any new or worsening symptoms.Vitals:  06/07/22 1351 06/07/22 1353 BP: 135/74  Pulse: 77  Resp: 18  Temp: (!) 96.8 ?F (36 ?C)  SpO2: 99%  Weight:  53.5 kg Attestation/Critical CareCritical care provided by attending: no critical carePatient progress: improvedStroke DocumentationStroke/TIA: NoClinical Impressions as of 06/07/22 1606 Spinal stenosis at L4-L5 level Herniated intervertebral disc of lumbar spine DispoDischargeSupervisionI have evaluated this patient; My supervising physician was available for consultation. Aileen Fass, PA08/22/23 [phone]

## 2022-06-09 ENCOUNTER — Encounter: Admit: 2022-06-09 | Payer: PRIVATE HEALTH INSURANCE | Attending: Medical | Primary: Family

## 2022-06-09 DIAGNOSIS — R102 Pelvic and perineal pain: Secondary | ICD-10-CM

## 2022-06-09 DIAGNOSIS — M4316 Spondylolisthesis, lumbar region: Secondary | ICD-10-CM

## 2022-06-10 ENCOUNTER — Ambulatory Visit: Admit: 2022-06-10 | Payer: MEDICARE | Primary: Family

## 2022-06-10 ENCOUNTER — Emergency Department: Admit: 2022-06-10 | Payer: MEDICARE | Primary: Family

## 2022-06-10 ENCOUNTER — Encounter: Admit: 2022-06-10 | Payer: PRIVATE HEALTH INSURANCE | Primary: Family

## 2022-06-10 ENCOUNTER — Inpatient Hospital Stay: Admit: 2022-06-10 | Discharge: 2022-06-12 | Payer: MEDICARE | Attending: Cardiovascular Disease | Primary: Family

## 2022-06-10 DIAGNOSIS — H409 Unspecified glaucoma: Secondary | ICD-10-CM

## 2022-06-10 DIAGNOSIS — R443 Hallucinations, unspecified: Secondary | ICD-10-CM

## 2022-06-10 DIAGNOSIS — R87619 Unspecified abnormal cytological findings in specimens from cervix uteri: Secondary | ICD-10-CM

## 2022-06-10 DIAGNOSIS — H3530 Unspecified macular degeneration: Secondary | ICD-10-CM

## 2022-06-10 LAB — URINE MICROSCOPIC     (BH GH LMW YH)
BKR RBC/HPF INSTRUMENT: 4 /HPF — ABNORMAL HIGH (ref 0–2)
BKR URINE SQUAMOUS EPITHELIAL CELLS, UA (NUMERIC): 4 /HPF (ref 0–5)
BKR WBC/HPF INSTRUMENT: 9 /HPF — ABNORMAL HIGH (ref 0–5)

## 2022-06-10 LAB — CBC WITH AUTO DIFFERENTIAL
BKR WAM ABSOLUTE IMMATURE GRANULOCYTES.: 0.07 x 1000/ÂµL (ref 0.00–0.30)
BKR WAM ABSOLUTE LYMPHOCYTE COUNT.: 1.22 x 1000/ÂµL (ref 0.60–3.70)
BKR WAM ABSOLUTE NEUTROPHIL COUNT.: 8.9 x 1000/ÂµL — ABNORMAL HIGH (ref 2.00–7.60)
BKR WAM BASOPHIL ABSOLUTE COUNT.: 0.02 x 1000/ÂµL (ref 0.00–1.00)
BKR WAM BASOPHILS: 0.2 % (ref 0.0–1.4)
BKR WAM EOSINOPHIL ABSOLUTE COUNT.: 0.03 x 1000/ÂµL (ref 0.00–1.00)
BKR WAM EOSINOPHILS: 0.3 % (ref 0.0–5.0)
BKR WAM HEMATOCRIT (2 DEC): 32.4 % — ABNORMAL LOW (ref 35.00–45.00)
BKR WAM HEMOGLOBIN: 10.7 g/dL — ABNORMAL LOW (ref 11.7–15.5)
BKR WAM IMMATURE GRANULOCYTES: 0.6 % (ref 0.0–1.0)
BKR WAM LYMPHOCYTES: 10.4 % — ABNORMAL LOW (ref 17.0–50.0)
BKR WAM MCH PG: 32.2 pg (ref 27.0–33.0)
BKR WAM MCHC: 33 g/dL (ref 31.0–36.0)
BKR WAM MCV: 97.6 fL (ref 80.0–100.0)
BKR WAM MONOCYTE ABSOLUTE COUNT.: 1.51 x 1000/ÂµL — ABNORMAL HIGH (ref 0.00–1.00)
BKR WAM MONOCYTES: 12.9 % — ABNORMAL HIGH (ref 4.0–12.0)
BKR WAM MPV: 10 fL (ref 8.0–12.0)
BKR WAM NEUTROPHILS: 75.6 % — ABNORMAL HIGH (ref 39.0–72.0)
BKR WAM NUCLEATED RED BLOOD CELLS: 0 % (ref 0.0–1.0)
BKR WAM PLATELETS: 200 x1000/ÂµL (ref 150–420)
BKR WAM RDW-CV: 13.2 % (ref 11.0–15.0)
BKR WAM RED BLOOD CELL COUNT.: 3.32 M/ÂµL — ABNORMAL LOW (ref 4.00–6.00)
BKR WAM WHITE BLOOD CELL COUNT: 11.8 x1000/ÂµL — ABNORMAL HIGH (ref 4.0–11.0)

## 2022-06-10 LAB — DRUGS OF ABUSE, URINE     (WH)
BKR AMPHETAMINES (WH): NEGATIVE
BKR BARBITURATES (WH): NEGATIVE
BKR BENZODIAZEPINES (WH): NEGATIVE
BKR COCAINE (WH): NEGATIVE
BKR MDMA (WH): NEGATIVE
BKR METHADONE (WH): NEGATIVE
BKR METHAMPHETAMINE (WH): NEGATIVE
BKR OPIATES (WH): NEGATIVE
BKR OXYCODONE (WH): NEGATIVE
BKR PHENCYCLIDINE (WH): NEGATIVE
BKR THC (WH): NEGATIVE
BKR TRICYCLICS (WH): NEGATIVE

## 2022-06-10 LAB — CK     (BH GH L LMW YH): BKR CREATINE KINASE TOTAL: 66 U/L (ref 26–192)

## 2022-06-10 LAB — COMPREHENSIVE METABOLIC PANEL
BKR ALANINE AMINOTRANSFERASE (ALT): 52 U/L (ref 12–78)
BKR ALBUMIN: 2.3 g/dL — ABNORMAL LOW (ref 3.4–5.0)
BKR ALKALINE PHOSPHATASE: 276 U/L — ABNORMAL HIGH (ref 45–117)
BKR ANION GAP (LM): 9 mmol/L (ref 5–15)
BKR ASPARTATE AMINOTRANSFERASE (AST): 49 U/L — ABNORMAL HIGH (ref 15–37)
BKR BILIRUBIN TOTAL: 0.5 mg/dL (ref 0.2–1.0)
BKR BLOOD UREA NITROGEN: 21 mg/dL — ABNORMAL HIGH (ref 7–18)
BKR CALCIUM: 9.1 mg/dL (ref 8.5–10.1)
BKR CHLORIDE: 102 mmol/L (ref 98–107)
BKR CO2: 23 mmol/L (ref 21–32)
BKR CREATININE: 0.95 mg/dL (ref 0.55–1.02)
BKR EGFR, CREATININE (CKD-EPI 2021): 58 mL/min/{1.73_m2} — ABNORMAL LOW (ref >=60)
BKR GLOBULIN: 5 g/dL (ref 2.5–5.0)
BKR GLUCOSE: 110 mg/dL (ref 65–110)
BKR POTASSIUM: 3.8 mmol/L (ref 3.5–5.1)
BKR PROTEIN TOTAL: 7.3 g/dL (ref 6.4–8.2)
BKR SODIUM: 134 mmol/L — ABNORMAL LOW (ref 136–145)

## 2022-06-10 LAB — ETHANOL     (BH GH L LMW YH): BKR ETHANOL (LM WH): <0.01 g/dL (ref <0.01)

## 2022-06-10 LAB — URINALYSIS WITH CULTURE REFLEX      (BH LMW YH)
BKR BILIRUBIN, UA: NEGATIVE
BKR GLUCOSE, UA: NEGATIVE
BKR KETONES, UA: NEGATIVE
BKR LEUKOCYTE ESTERASE, UA: NEGATIVE
BKR NITRITE, UA: NEGATIVE
BKR PH, UA: 5.5 (ref 5.5–7.5)
BKR SPECIFIC GRAVITY, UA: 1.017 (ref 1.005–1.030)
BKR UROBILINOGEN, UA (MG/DL): <2 mg/dL (ref <=2.0)

## 2022-06-10 LAB — FENTANYL, URINE, WITH NO CONFIRMATION (BH GH L LMW YH): BKR FENTANYL: NEGATIVE

## 2022-06-10 LAB — GAMMA GT: BKR GAMMA GLUTAMYL TRANSFERASE: 44 U/L (ref 5–85)

## 2022-06-10 LAB — UA REFLEX CULTURE

## 2022-06-10 MED ORDER — SODIUM CHLORIDE 0.9 % (FLUSH) INJECTION SYRINGE
0.9 % | INTRAVENOUS | Status: DC | PRN
Start: 2022-06-10 — End: 2022-06-13

## 2022-06-10 MED ORDER — ENOXAPARIN 40 MG/0.4 ML SUBCUTANEOUS SYRINGE
40 mg/0.4 mL | SUBCUTANEOUS | Status: DC
Start: 2022-06-10 — End: 2022-06-13
  Administered 2022-06-11 – 2022-06-12 (×2): 40 mg/0.4 mL via SUBCUTANEOUS

## 2022-06-10 MED ORDER — SODIUM CHLORIDE 0.9 % (FLUSH) INJECTION SYRINGE
0.9 % | Freq: Three times a day (TID) | INTRAVENOUS | Status: DC
Start: 2022-06-10 — End: 2022-06-13
  Administered 2022-06-11: 02:00:00 0.9 mL via INTRAVENOUS

## 2022-06-10 MED ORDER — ACETAMINOPHEN 500 MG TABLET
500 mg | Freq: Once | ORAL | Status: CP
Start: 2022-06-10 — End: ?
  Administered 2022-06-10: 19:00:00 500 mg via ORAL

## 2022-06-10 MED ORDER — CYCLOBENZAPRINE 5 MG TABLET
5 mg | Freq: Three times a day (TID) | ORAL | Status: DC | PRN
Start: 2022-06-10 — End: 2022-06-13
  Administered 2022-06-12: 03:00:00 5 mg via ORAL

## 2022-06-10 MED ORDER — VYZULTA 0.024 % EYE DROPS
0.024 % | Freq: Every evening | OPHTHALMIC | Status: AC
Start: 2022-06-10 — End: ?

## 2022-06-10 MED ORDER — KETOROLAC 30 MG/ML (1 ML) INJECTION SOLUTION
30 mg/mL (1 mL) | Freq: Four times a day (QID) | INTRAVENOUS | Status: DC | PRN
Start: 2022-06-10 — End: 2022-06-13
  Administered 2022-06-11 (×2): 30 mL via INTRAVENOUS

## 2022-06-10 MED ORDER — ACETAMINOPHEN 325 MG TABLET
325 mg | Freq: Four times a day (QID) | ORAL | Status: DC
Start: 2022-06-10 — End: 2022-06-13
  Administered 2022-06-10 – 2022-06-12 (×8): 325 mg via ORAL

## 2022-06-10 MED ORDER — CYCLOBENZAPRINE 5 MG TABLET
5 mg | Freq: Three times a day (TID) | ORAL | Status: CP
Start: 2022-06-10 — End: ?
  Administered 2022-06-10 – 2022-06-11 (×3): 5 mg via ORAL

## 2022-06-10 MED ORDER — LIDOCAINE 5 % ADHESIVE PATCH
5 % | Freq: Once | TRANSDERMAL | Status: CP
Start: 2022-06-10 — End: ?

## 2022-06-10 MED ORDER — LIDOCAINE 4 % TOPICAL PATCH
4 % | TRANSDERMAL | Status: DC
Start: 2022-06-10 — End: 2022-06-13

## 2022-06-10 MED ORDER — SODIUM CHLORIDE 0.9 % BOLUS (NEW BAG)
0.9 % | Freq: Once | INTRAVENOUS | Status: CP
Start: 2022-06-10 — End: ?
  Administered 2022-06-10: 17:00:00 0.9 mL/h via INTRAVENOUS

## 2022-06-10 MED ORDER — MELATONIN 3 MG TABLET
3 mg | Freq: Every evening | ORAL | Status: DC
Start: 2022-06-10 — End: 2022-06-13
  Administered 2022-06-11 – 2022-06-12 (×2): 3 mg via ORAL

## 2022-06-10 NOTE — ED Notes
Assume care of pt. Pt presenting to ED with daughter in law. Pt with persistent back/neck pain that pt has been progressively gotten worse. Pt was placed on tramadol and prednisone two weeks ago. Pt reports only needing to take an occasional tramadol at that time. Pt reports her pain worsened over the last week so she has been needing to take tramadol more frequently (twice a day vs once every other day). Pt reports since taking tramadol more often she has been noticing shapes of ? People in her home. Pt states, I know I live alone so I know that they shouldn't be there. Pt  got up this morning in pain and last took a tramdol dose at 2a.  After taking her tramadol pt had a mechanical fall causing  her to fall into her closet.  + head strike. Denies LOC. GCS-15. PERRL 2 mm noted bilaterally. Breathing even and unlabored. Lungs cta bilaterally. + abrasion noted to right elbow. +CMS noted in RUE. Pt also endorses urinary incontinence at night which is not new for patient. Pt with good strength noted in BLE.  Family and pt updated on plan of care.  Side rails up x2. Will continue to monitor.

## 2022-06-10 NOTE — ED Provider Notes
Chief Complaint Patient presents with ? Hallucinations   Pt states that she has had hand pain for the past few months and has had no relief and the pain has progressed to arm, head and back. Pt's family states that patient has not been eating or drinking and has had multiple falls due to weakness. Family also states that patient has been having visual hallucinations after starting Tramadol, stopped taking it but has still had occasional hallucinations. Family states it is unsafe for patient at home due to falls. HPI/PE:88 year old female arrives emergency department via private vehicle with her family member for fall, increased back pain and was.  She states she has been back pain and neck pain for at least the 3 weeks.  Typically lives alone and does not use a walker on a daily basis.  She was seen in this emergency department 4 days ago for worsening back pain and had a [place] of her lumbar spine.  She then followed up with an orthopedic who recommended an MRI.  Initially seeing her primary care doctor who treated her with prednisone, Ultram and cyclobenzaprine.  She is been having hallucinations, stating that she has been seeing people and then she turns her head, and they are not there.  Also saw shapes on the floor.  She does believe this happens after she takes the Ultram.  Last dose of Ultram was 2 in the morning.  States she was standing when she had dizziness and fell into a closet.  States she did hit her head, but denies any loss of consciousness.  States she was able to get herself off the ground.  She is also had incidents of incontinence at night time, which is unusual for her.  She feels that she is not eating and drinking very well due to the amount of pain she is in.  She called her primary care doctor who recommended she return to the emergency department for likely admission.MDM:88 year old female with history of hypertension and hyperlipidemia, arrives emergency department with increased back pain, neck pain, decreased appetite, and recent fall.  Patient states that due to her pain she is not eating and drinking very well.  She is been taking Ultram for comfort but does make her feel dizzy as well as she believes causing hallucinations.  States the last dose she took was 2:00 a.m. this morning.  She denies any fever or chills.  Patient lives alone, and states that she is not getting out of bed very often due to the discomfort.  Does not typically use a walker.  Physical ExamED Triage Vitals [06/10/22 1131]BP: (!) 155/75Pulse: 85Pulse from  O2 sat: n/aResp: 18Temp: 97.6 ?F (36.4 ?C)Temp src: TemporalSpO2: 99 % BP (!) 155/75  - Pulse 85  - Temp 97.6 ?F (36.4 ?C) (Temporal)  - Resp 18  - Wt 53 kg  - SpO2 99%  - BMI 20.70 kg/m? Physical ExamVitals and nursing note reviewed. Constitutional:     Appearance: Normal appearance. HENT:    Head: Normocephalic and atraumatic.    Nose: Nose normal.    Mouth/Throat:    Mouth: Mucous membranes are moist.    Pharynx: Oropharynx is clear. Eyes:    Extraocular Movements: Extraocular movements intact.    Conjunctiva/sclera: Conjunctivae normal.    Pupils: Pupils are equal, round, and reactive to light. Cardiovascular:    Rate and Rhythm: Normal rate and regular rhythm.    Pulses: Normal pulses.    Heart sounds: Normal heart sounds. Pulmonary:  Effort: Pulmonary effort is normal.    Breath sounds: Normal breath sounds. Abdominal:    General: Abdomen is flat. Bowel sounds are normal.    Palpations: Abdomen is soft. Musculoskeletal:    Cervical back: Normal range of motion and neck supple.    Lumbar back: Tenderness present. No swelling, edema, deformity, signs of trauma, lacerations, spasms or bony tenderness. Decreased range of motion. Negative right straight leg raise test and negative left straight leg raise test. No scoliosis. Skin:   General: Skin is warm. Findings: Abrasion present.    Neurological:    General: No focal deficit present.    Mental Status: She is alert and oriented to person, place, and time.    Cranial Nerves: No cranial nerve deficit.    Sensory: No sensory deficit.    Coordination: Coordination normal. Psychiatric:       Mood and Affect: Mood normal.  ProceduresAttestation/Critical CarePatient Reevaluation: 88 year old female arrives emergency department with hallucinations, lower back pain and neck pain, decreased appetite and intake.  Does appear that she is not getting out of bed due to discomfort.  She believes the hallucinations start after she takes the Ultram.  Currently patient is alert and oriented, not actively hallucinating.  She did have a fall this morning around 2:00 a.m. so [place] of her head and neck were performed. Results for orders placed or performed during the hospital encounter of 06/10/22-Drugs of abuse, urine:      Result                      Value             Ref Range         Phencyclidine               Negative          (NEG) **Cut *     Benzodiazepines             Negative          (NEG) **Cut *     Amphetamines                Negative          (NEG) **Cut *     Marijuana                   Negative          (NEG) **Cut *     Opiates                     Negative          (NEG) **Cut *     Barbiturates                Negative          (NEG) **Cut *     Tricyclic Antidepressa*     Negative          (NEG) **Cut *     Methadone                   Negative          (NEG) **Cut *     MDMA (Methylenedioxyme*     Negative          (NEG) **Cut *     Oxycodone                   Negative          (  NEG) **Cut *     Methamphetamine             Negative          (NEG) **Cut *     Cocaine                     Negative          (NEG) **Cut *-Ethanol:      Result                      Value             Ref Range         Ethanol Level               <0.01 <0.01 g/dL   -Urinalysis with culture reflex     ([facility]): Specimen: Urine     Result                      Value             Ref Range         Clarity, UA                 Turbid (A)        Clear             Color, UA                   Yellow            Yellow, Colo*     Specific Gravity, UA        1.017             1.005 - 1.030     pH, UA                      5.5               5.5 - 7.5         Protein, UA                 1+ (A)            Negative, Tr*     Glucose, UA                 Negative          Negative          Ketones, UA                 Negative          Negative          Blood, UA                   1+ (A)            Negative          Bilirubin, UA               Negative          Negative          Leukocytes, UA              Negative          Negative          Nitrite, UA  Negative          Negative          Urobilinogen, UA            <2.0              <=2.0 mg/dL  -Comprehensive metabolic panel:      Result                      Value             Ref Range         Sodium                      134 (L)           136 - 145 mm*     Potassium                   3.8               3.5 - 5.1 mm*     Chloride                    102               98 - 107 mmo*     CO2                         23                21 - 32 mmol*     Anion Gap                   9                 5 - 15 mmol/L     Glucose                     110               65 - 110 mg/*     BUN                         21 (H)            7 - 18 mg/dL      Creatinine                  0.95              0.55 - 1.02 *     Calcium                     9.1               8.5 - 10.1 m*     Total Protein               7.3               6.4 - 8.2 g/*     Albumin                     2.3 (L)           3.4 - 5.0 g/*     Globulin                    5.0  2.5 - 5.0 g/*     Total Bilirubin             0.5               0.2 - 1.0 mg*     Alkaline Phosphatase        276 (H) 45 - 117 U/L      Alanine Aminotransfera*     52                12 - 78 U/L       Aspartate Aminotransfe*     49 (H)            15 - 37 U/L       eGFR (Creatinine)           58 (L)            >=60 mL/min/*-CBC auto differential:      Result                      Value             Ref Range         WBC                         11.8 (H)          4.0 - 11.0 x*     RBC                         3.32 (L)          4.00 - 6.00 *     Hemoglobin                  10.7 (L)          11.7 - 15.5 *     Hematocrit                  32.40 (L)         35.00 - 45.0*     MCV                         97.6              80.0 - 100.0*     MCH                         32.2              27.0 - 33.0 *     MCHC                        33.0              31.0 - 36.0 *     RDW-CV                      13.2              11.0 - 15.0 %     Platelets                   200               150 - 420 x1*     MPV  10.0              8.0 - 12.0 fL     Neutrophils                 75.6 (H)          39.0 - 72.0 %     Lymphocytes                 10.4 (L)          17.0 - 50.0 %     Monocytes                   12.9 (H)          4.0 - 12.0 %      Eosinophils                 0.3               0.0 - 5.0 %       Basophil                    0.2               0.0 - 1.4 %       Immature Granulocytes       0.6               0.0 - 1.0 %       nRBC                        0.0               0.0 - 1.0 %       ANC (Abs Neutrophil Co*     8.90 (H)          2.00 - 7.60 *     Absolute Lymphocyte Co*     1.22              0.60 - 3.70 *     Monocyte Absolute Count     1.51 (H)          0.00 - 1.00 *     Eosinophil Absolute Co*     0.03              0.00 - 1.00 *     Basophil Absolute Count     0.02              0.00 - 1.00 *     Absolute Immature Gran*     0.07              0.00 - 0.30 *-Urine microscopic     (BH [facility]):      Result                      Value             Ref Range         RBC/HPF, UA 4 (H)             0 - 2 /HPF        WBC/HPF, UA                 9 (H)             0 - 5 /HPF        Bacteria, UA  Rare              None-Rare /H*     Urine Squamous Epithel*     4                 0 - 5 /HPF   [place] Cervical Spine wo IV ContrastResult Date: 8/25/2023CT CERVICAL SPINE WO IV CONTRAST  HISTORY: Neck trauma (Age >= 65y). COMPARISON: None. V4098 - Total number of known [place] scans and cardiac nuclear medicine studies within the past 12 months: 3 J1914 - RADIATION DOSE ACQUIRED DURING SCAN: 281.91 mGy.cm. The [employer] strives for high quality imaging with the lowest possible radiation dose. For more information on medical radiation exposure please visit: [email or web address] . TECHNIQUE: Axial sections were obtained through the cervical spine without intravenous contrast. The study was supplemented with sagittal and coronal reformatted images. 3D/MIP reformatted images were constructed on a dependent workstation and stored per protocol. Adjustment of mA and/or kV was done according to patient size. FINDINGS:  There is amalgam artifact. There is bilateral apical pleural-parenchymal disease likely from old granulomatous infection. Multilevel disc space narrowing is noted. There is amalgam artifact. There is minimal anterolisthesis of C3 on C4 C4 on C5.  There is normal alignment..  There is normal maintenance of the vertebral body heights.  There is no evidence for fracture.  The structures anterior to the spine are unremarkable.   Degenerative change. No evidence of fracture or dislocation. Coronal and sagittal reformatted images confirm the above findings. Reported and signed by:  Anne Shutter, MD [place] wo IV ContrastResult Date: 8/25/2023CT HEAD WO IV CONTRAST  HISTORY: Head trauma, minor (Age >= 65y). COMPARISON: None. N8295 - Total number of known [place] scans and cardiac nuclear medicine studies within the past 12 months: 3 A2130 - RADIATION DOSE ACQUIRED DURING SCAN: 637.19 mGy.cm. [employer] strives for high quality imaging with the lowest possible radiation dose. For more information on medical radiation exposure please visit: [email or web address] . TECHNIQUE:  5 mm axial images were taken from the foramen magnum through the top of the skull without contrast. Adjustment of mA and/or kV was done according to patient size. FINDINGS:  There is atherosclerotic disease of the carotid arteries..  Symmetrical and proportional dilatation of the ventricles and sulci is noted, consistent with age-related cerebral atrophy.   There is periventricular white matter change, most likely secondary to small vessel ischemic disease. There are no other focal or diffuse parenchymal lesions. There is no evidence of intra-axial or extra-axial mass, fluid collection, hemorrhage, or infarction.  No mass effect or midline shift is seen. The visualized mastoid air cells and paranasal sinuses are clear. Diffuse age-related cerebral atrophy. Periventricular white matter change, likely due to chronic small vessel ischemic disease. Reported and signed by:  Anne Shutter, MD Patient ambulated with RN, RN states that she is very unsteady, to ambulate on her own.  Will admit patient to hospitalist service.  Supervising physician was available for consult.  Patient independently examined by mePatient progress: improvedClinical Impressions as of 06/10/22 1409 Acute bilateral low back pain without sciatica Hallucinations  ED DispositionObservation Corie Chiquito, APRN08/25/23 1409

## 2022-06-10 NOTE — H&P
Medicine Admission History & Physical ExamProvider: Melton Alar, MDPatient Data:  Patient Name: Sarah Montes Age: 88 y.o. DOB: [birth year]	 MRN: [id number]	 PCP: Samuella Cota Complaint CC: Low back painHPI Sarah Montes is a 88 y.o. year old woman with a PMH significant for glaucoma and urinary incontinence, who presented with c/o low back pain.  Pain started about 3 weeks ago.  And at that time she saw her primary care doctor who prescribed prednisone and tramadol.  She took about 6 days of prednisone and a few doses of tramadol.  Pain subsequently completely resolved, hence she discontinued both.  She was doing well up until 5 days ago, when the pain recurred.  Pain is located in the left lower back.  She denied any radiation.  It has worsened over the past 2 days, so much so that this morning she fell into her closet hitting her right shoulder and right hip.  She did also hit her head.  She denied any focal numbness or weakness.  She does have a longstanding history of urinary incontinence usually at nighttime, which is at her baseline.  Her last bowel movement was 2 days ago, and she has not had 1 since.  She denied any fevers or chills.On presentation to the ED, she was afebrile and was noted to be hypertensive.  She was saturating well on room air.  Blood work was consistent with a white count of 11.8.  Hemoglobin was 10.7, with a baseline of around 12 from October 2022.  Sodium was 134.  Alkaline phosphatase was elevated at 276, which was previously normal in 2022.  AST was 49 with normal ALT.  Alcohol level was negative.  Urine tox was negative.  Urinalysis was consistent with 4 RBCs and 9 WBCs.  [place] head and C-spine did not show any acute findings.  She received 500 cubic centimeters of normal saline, was started on Tylenol and lidocaine patch.  She is now being admitted for further management.ROS: A 14 point review of systems is negative, except for the above Other Histories MEDICATIONS:No outpatient medications have been marked as taking for the 06/10/22 encounter [facility] Encounter). Allergies:  Allergies Allergen Reactions ? Adhesive Rash   TapeTapeTapeTape ? Alendronate GI Upset, Nausea And Vomiting and Other (See Comments) ? Alendronic Acid Nausea And Vomiting ? Atorvastatin Other (See Comments) and Unknown   Elevated ckElevated ckElevated ckOther reaction(s): Other (See Comments)Elevated ckElevated ckElevated ck ? Penicillins Other (See Comments) ? Phenylalanine Other (See Comments)   Other reaction(s): Other (See Comments) ? Oxycodone Rash Medical/Surgical History:Past Medical History: Diagnosis Date ? Abnormal cervical Papanicolaou smear  ? Glaucoma  ? Macular degeneration disease  Past Surgical History: Procedure Laterality Date ? BLADDER SUSPENSION   ? CATARACT EXTRACTION   ? HYSTERECTOMY   Family History:  Family History Problem Relation Age of Onset ? Breast cancer Mother  ? Breast cancer Maternal Aunt  ? Ovarian cancer Maternal Grandmother   Social History:  Social History Socioeconomic History ? Marital status: Single   Spouse name: Not on file ? Number of children: Not on file ? Years of education: Not on file ? Highest education level: Not on file Occupational History ? Not on file Tobacco Use ? Smoking status: Never ? Smokeless tobacco: Not on file Vaping Use ? Vaping Use: Never used Substance and Sexual Activity ? Alcohol use: Yes   Comment: rare ? Drug use: Not on file ? Sexual activity: Not Currently Other Topics Concern ? Not on file  Social History Narrative ? Not on file Social Determinants of Health Financial Resource Strain: Not on file Food Insecurity: Not on file Transportation Needs: Not on file Physical Activity: Not on file Stress: Not on file Social Connections: Not on file Intimate Partner Violence: Not on file Housing Stability: Not on file Physical Exam Physical Exam:Temp:  [97.6 ?F (36.4 ?C)] 97.6 ?F (36.4 ?C)Pulse:  [85] 85Resp:  [18] 18BP: (155)/(75) 155/75SpO2:  [99 %] 99 %Physical ExamConstitutional:     General: She is not in acute distress.   Appearance: She is not diaphoretic. HENT:    Head: Normocephalic and atraumatic. Cardiovascular:    Rate and Rhythm: Normal rate and regular rhythm.    Heart sounds: Normal heart sounds. No murmur heard.   No friction rub. No gallop. Pulmonary:    Effort: Pulmonary effort is normal. No respiratory distress.    Breath sounds: No wheezing or rales. Chest:    Chest wall: No tenderness. Abdominal:    General: Bowel sounds are normal. There is no distension.    Palpations: Abdomen is soft. There is no mass.    Tenderness: There is no abdominal tenderness. There is no guarding or rebound. Musculoskeletal:       General: Normal range of motion.    Cervical back: Normal range of motion and neck supple. Skin:   General: Skin is warm and dry.    Findings: No erythema or rash. Neurological:    Mental Status: She is alert and oriented to person, place, and time.    Sensory: No sensory deficit.    Motor: No weakness.    Coordination: Coordination normal.    Deep Tendon Reflexes: Reflexes normal (Hyperreflexic at the knee but symmetrical bilaterally.  Ankle reflex hyporeflexic bilaterally.  Babinski negative bilaterally). Labs and imaging Recent Results (from the past 24 hour(s)) Drugs of abuse, urine  Collection Time: 06/10/22 12:41 PM Result Value Ref Range  Phencyclidine Negative [NEG] **Cut off 25 ng/mL  Benzodiazepines Negative [NEG] **Cut off 300 ng/mL  Amphetamines Negative [NEG] **Cut off 1000 ng/mL  Marijuana Negative [NEG] **Cut off 50 ng/mL  Opiates Negative [NEG] **Cut off 300 ng/mL  Barbiturates Negative [NEG] **Cut off 300 ng/mL  Tricyclic Antidepressants Negative [NEG] **Cut off 1000 ng/mL  Methadone Negative [NEG] **Cut off 300 ng/mL  MDMA (Methylenedioxymethamphetamine) Negative [NEG] **Cut off 500 ng/mL  Oxycodone Negative [NEG] **Cut off 100 ng/mL  Methamphetamine Negative [NEG] **Cut off 1000 ng/mL  Cocaine Negative [NEG] **Cut off 300 ng/mL Urinalysis with culture reflex     ([facility] LM[facility])  Collection Time: 06/10/22 12:41 PM  Specimen: Urine Result Value Ref Range  Clarity, UA Turbid (A) Clear  Color, UA Yellow Yellow, Colorless  Specific Gravity, UA 1.017 1.005 - 1.030  pH, UA 5.5 5.5 - 7.5  Protein, UA 1+ (A) Negative, Trace  Glucose, UA Negative Negative  Ketones, UA Negative Negative  Blood, UA 1+ (A) Negative  Bilirubin, UA Negative Negative  Leukocytes, UA Negative Negative  Nitrite, UA Negative Negative  Urobilinogen, UA <2.0 <=2.0 mg/dL Urine microscopic     (BH [facility] LM[facility])  Collection Time: 06/10/22 12:41 PM Result Value Ref Range  RBC/HPF, UA 4 (H) 0 - 2 /HPF  WBC/HPF, UA 9 (H) 0 - 5 /HPF  Bacteria, UA Rare None-Rare /HPF  Urine Squamous Epithelial Cells, UA 4 0 - 5 /HPF Ethanol  Collection Time: 06/10/22 12:43 PM Result Value Ref Range  Ethanol Level <0.01 <0.01 g/dL Comprehensive metabolic panel  Collection Time: 06/10/22 12:43  PM Result Value Ref Range  Sodium 134 (L) 136 - 145 mmol/L  Potassium 3.8 3.5 - 5.1 mmol/L  Chloride 102 98 - 107 mmol/L  CO2 23 21 - 32 mmol/L  Anion Gap 9 5 - 15 mmol/L  Glucose 110 65 - 110 mg/dL  BUN 21 (H) 7 - 18 mg/dL  Creatinine 1.61 0.96 - 1.02 mg/dL  Calcium 9.1 8.5 - 04.5 mg/dL  Total Protein 7.3 6.4 - 8.2 g/dL  Albumin 2.3 (L) 3.4 - 5.0 g/dL  Globulin 5.0 2.5 - 5.0 g/dL  Total Bilirubin 0.5 0.2 - 1.0 mg/dL  Alkaline Phosphatase 409 (H) 45 - 117 U/L  Alanine Aminotransferase (ALT) 52 12 - 78 U/L  Aspartate Aminotransferase (AST) 49 (H) 15 - 37 U/L  eGFR (Creatinine) 58 (L) >=60 mL/min/1.37m2 CBC auto differential  Collection Time: 06/10/22 12:43 PM Result Value Ref Range  WBC 11.8 (H) 4.0 - 11.0 x1000/?L  RBC 3.32 (L) 4.00 - 6.00 M/?L  Hemoglobin 10.7 (L) 11.7 - 15.5 g/dL  Hematocrit 81.19 (L) 14.78 - 45.00 %  MCV 97.6 80.0 - 100.0 fL  MCH 32.2 27.0 - 33.0 pg  MCHC 33.0 31.0 - 36.0 g/dL  RDW-CV 29.5 62.1 - 30.8 %  Platelets 200 150 - 420 x1000/?L  MPV 10.0 8.0 - 12.0 fL  Neutrophils 75.6 (H) 39.0 - 72.0 %  Lymphocytes 10.4 (L) 17.0 - 50.0 %  Monocytes 12.9 (H) 4.0 - 12.0 %  Eosinophils 0.3 0.0 - 5.0 %  Basophil 0.2 0.0 - 1.4 %  Immature Granulocytes 0.6 0.0 - 1.0 %  nRBC 0.0 0.0 - 1.0 %  ANC (Abs Neutrophil Count) 8.90 (H) 2.00 - 7.60 x 1000/?L  Absolute Lymphocyte Count 1.22 0.60 - 3.70 x 1000/?L  Monocyte Absolute Count 1.51 (H) 0.00 - 1.00 x 1000/?L  Eosinophil Absolute Count 0.03 0.00 - 1.00 x 1000/?L  Basophil Absolute Count 0.02 0.00 - 1.00 x 1000/?L  Absolute Immature Granulocyte Count 0.07 0.00 - 0.30 x 1000/?L [place] Cervical Spine wo IV ContrastResult Date: 06/10/2022  Degenerative change. No evidence of fracture or dislocation. Coronal and sagittal reformatted images confirm the above findings. Reported and signed by:  Anne Shutter, MD [place] wo IV ContrastResult Date: 8/25/2023Diffuse age-related cerebral atrophy. Periventricular white matter change, likely due to chronic small vessel ischemic disease. Reported and signed by:  Anne Shutter, MD [place] Lumbar Spine wo IV ContrastResult Date: 8/22/20231. Moderate lumbar spondylosis notably L4-L5 level demonstrating moderate severe spinal stenosis. Impingement upon nerve roots within lateral recesses cannot be excluded. Further assessment with MRI recommended. 2. Additional findings as noted. Reported and signed by:  Valla Leaver, MD Assesment Sarah Montes is a 88 y.o. year old woman with a PMH significant for glaucoma and urinary incontinence, who presented with a three-week history of lower back pain without any radiation, which recurred again about 5 days ago with worsening over the past 2 days.  Admitting now for further managementPlan #Low back painInitially improved with prednisone and tramadolHowever, says she gets hallucinations with tramadol, hence will attempt to hold off on narcoticStart scheduled Tylenol every 6 hours.  Toradol IV p.r.n..  Lidocaine patch.  Flexeril 3 times daily for 3 doses, in addition to p.r.n..  Consider adding steroids.  PT/OTCheck MRI lumbar spine [place] [facility] spine from 08/22 consistent with moderate lumbar spondylosis and moderate severe spinal stenosis at L4-L5No red flags noted on history and examination#Elevated alkaline phosphatase Also noted mild elevation in AST with normal ALT and bilirubin  Check GGT.  Monitor CMP and check INR tomorrow morning#Urinary incontinence Says this has been ongoing for many years and has had previous bladder surgeries Appears to be at baseline.  Continue to monitorDiet: Diet RegularVTE prophylaxis: LovenoxCode status: Full CodeCare/plan discussed with patient at the bedside, who understands and agrees with the above plan.I have confirmed that the patient's advanced care plan is present, code status is documented, or surrogate decision maker is listed in the patient's medical record.I have utilized all available immediate resources to obtain, update, or review the patient's current medications.Signed:Melton Alar, MDBeeper: [phone]/25/[id number]:21This report was created in part from templates and voice recognition software. Typographical and minor dictation errors may be present.

## 2022-06-10 NOTE — Other
PHARMACY-ASSISTED MEDICATION HISTORYPharmacist review of the medication history obtained by the pharmacy technician has been performed. After reviewing the home medication list in the admission medication reconciliation module, I have identified the following: Sources:Patient interviewEpic Rx Dispense HistoryMedications Flagged for Removal:Azelastine nasal sprauLatanoprost opth solution NaproxenNetarsudilValtrex Adjusted Medications:Ketoconazole cream to prnEye Vites to [facility] Medications:Vyzulta ophthalmic gelNotes:Interviewed patient bedside.Verified meds and allergies. Patient has prescriptions for gabapentin and cyclobenzaprine from 8/11. She states she is not taking them.Bernerd Limbo [place]ier, CPHT8/25/20232:38 PMIssues:   Thank Billy Coast, RPH3:05 PM8/25/2023Phone: [phone] Signed by Sherol Dade, RPH, August 25, 2023Outpatient Medications Marked as Taking for the 06/10/22 encounter [facility] Encounter) Medication Sig Dispense Refill  brimonidine (ALPHAGAN) 0.2 % ophthalmic solution Place 1 drop into both eyes 2 (two) times daily.    calcium carbonate (CALTRATE 600) 1500 mg (600 mg calcium) tablet Take 1 tablet (600 mg total) by mouth daily.    cholecalciferol, vitamin D3, 25 mcg (1,000 unit) tablet Take 1 tablet (1,000 Units total) by mouth daily.    dorzolamide-timoloL (COSOPT) 22.3-6.8 mg/mL ophthalmic solution Place 1 drop into the left eye 2 (two) times daily.    ketoconazole (NIZORAL) 2 % cream daily as needed.    latanoprostene bunod (VYZULTA) 0.024 % ophthalmic solution Place 1 drop into both eyes nightly.    traMADoL (ULTRAM) 50 mg tablet Take 1 tablet (50 mg total) by mouth every 6 (six) hours as needed for pain for up to 12 doses. 12 tablet 0  vit C-E-cupric-zinc-lutein 226-90-0.8-5 mg Cap Take 1 capsule by mouth 2 (two) times daily.

## 2022-06-10 NOTE — Plan of Care
Problem: Adult Inpatient Plan of CareGoal: Readiness for Transition of CareOutcome: Outcome(s) achieved Plan of Care Overview/ Patient Status    Case Management Screening and Evaluation  88 year old female arrives emergency department via private vehicle with her family member for fall, increased back pain and was.  She states she has been back pain and neck pain for at least the 3 weeks.  Typically lives alone and does not use a walker on a daily basis.  She was seen in this emergency department 4 days ago for worsening back pain and had a [place] of her lumbar spine.  She then followed up with an orthopedic who recommended an MRI.  Patient from home alone. Uses a RW. Three steps to enter, bedroom on the second floor. Telephone visit with PCP/Dr. Mardene Celeste this am. [facility]. Patient inquired about rehab. Process explained. [place] completed. Patient informed of OBSV status and the need to private pay if she goes to rehab. Pending PT/OT recommendation. CM will continue to follow for discharge needs. - CM has discussed the observation status with the patient , next of kin, POA or conservator. Cm explained the patient is not admitted to the hospital. For a patient with traditional Medicare Part A and Part B insurance, this hospital visit is being billed to their Medicare Part B as an outpatient visit with observation services. Medicare Part B does not provide coverage for a nursing home stay and observation days do not count toward a 3 night qualifying stay at a nursing home. - For a patient with commercial managed Medicare insurance, this hospital visit may have a co-pay which can be determined by   contacting the insurance company.- For a patient with Medicaid insurance, there is no co-pay- The expected time to complete this out-patient work-up is between 24-48 hours. Flowsheet Row Most Recent Value Case Management Screening: Chart review completed. If YES to any question below then proceed to CM Eval/Plan  Is there a change in their cognitive function No Do you anticipate a change in this patient's physicial function that will effect discharge needs? Yes Has there been a readmission within the last 30 days No Were there services prior to admission ( Examples: Assisted Living, HD, Homecare, Extended Care Facility, Methadone, SNF, Outpatient Infusion Center) No Negative/Positive Screen Positive Screening: Complete CM Evaluation and Plan Case Manager Attestation  I have reviewed the medical record and completed the above screen. CM staff will follow patient's progress and discuss the plan of care with the Treatment Team. Yes Case Management Evaluation and Plan  Arrived from prior to admission home/apartment/condo Admitted from: home Do you have a caregiver, or do you anticipate the need for a caregiver given the change in your physicial function? No  Lives With Alone Services Prior to Admission none Patient Requires Care Coordination Intervention Due To discharge planning needs/concerns Prior to Hospitalization: Assistance Needed/DME being used None Documented Insurance Accurate Yes Any financial concerns related to anticipated discharge needs No Patient's home address verified Yes Patient's PCP of record verified Yes Last Date Seen by PCP 0-3 months Source of Clinical History  Patient's clinical history has been reviewed and source of Information is: Patient Case Manager Attestation  I have reviewed the medical record and completed the above evaluation with the following recommendations. Yes   Mervyn Skeeters RN, BSN, Scientist, clinical (histocompatibility and immunogenetics)

## 2022-06-10 NOTE — Utilization Review (ED)
UM Status: Meets Observation Status   hallucinations , lower back pain , unsteady gait, but able to ambulate and neck pain decrease appetite wbc 11.8 [place] of head;Diffuse age-related cerebral atrophy. Periventricular white matter change, likely due to chronic small vessel ischemic diseaseTylenol  Lidoderm patch  iv fluid bolus

## 2022-06-10 NOTE — ED Notes
2:44 PM MD Welton Flakes at bedside to eval

## 2022-06-10 NOTE — ED Notes
2:11 PM [facility] Emergency Department Handoff Report: Sarah Montes presented to the emergency department seeking treatment for Hallucinations (Pt states that she has had hand pain for the past few months and has had no relief and the pain has progressed to arm, head and back. Pt's family states that patient has not been eating or drinking and has had multiple falls due to weakness. Family also states that patient has been having visual hallucinations after starting Tramadol, stopped taking it but has still had occasional hallucinations. Family states it is unsafe for patient at home due to falls.)Vitals on Arrival: ED Triage Vitals [06/10/22 1131] Enc Vitals Group    BP (!) 155/75    Pulse 85    Resp 18    Temp 97.6 ?F (36.4 ?C)    Temp src Temporal    SpO2 99 %    Weight 53 kg    Height     Head Circumference     Peak Flow     Pain Score     Pain Loc     Pain Edu?     Excl. in GC?  Current Dx: Clinical Impressions as of 06/10/22 1412 Acute bilateral low back pain without sciatica Hallucinations Telemetry: 	[x]  Yes		[]  NoCode Status:   [x]  Full		[]  DNR		[]  DNI		Other (specify):Safety Precautions: []  None	[]  Sitter   []  Restraints	[]  Suicidal	[x]  Fall Risk	Other (specify):Mentation/Orientation:	 A&O (Self, person, place, time) x   3       	 Disoriented to:                    	 Deficits: []  Hearing impaired	[]  Blind  []  Nonverbal	 []  Other: ____________________________	Result of CAM (Non ICU): NegativeOxygenation Upon Admission: []  RA	[]  NC	[]  Venti  []  Simple Mask []  Other 	Baseline O2 Status? []  Yes	[]  NoAmbulation: []  Independent	[]  Cane   [x]  Walker	[]  Wheelchair	[]  Bedbound		[]  Hemiplegic	[]  Paraplegic	[]  QuadraplegicEliminiation: []  Independent	[]  Commode	[]  Bedpan/Urinal  []  Straight Cath []  Foley cath			[]  Urostomy	[]  Colostomy	Other (specify): needs help with walker.Diarrhea/Loose stool : []  1x within 24h []  2x within 24h  []  3x within 24h  []  None 	C.Diff Order: 	[]  Ordered- needs to be collected             []  Collected-sent to lab             []  Resulted - Negative C.Diff             []  Resulted - Positive C.Diff[]  Not Ordered   []  N/ASkin Alteration: []  Pressure Injury []  Wound [x]  None []  Skin not assessedDiet: [x]  Regular/No order placed	[]  NPO		Other (specify):IV Access: Access Lines   Active PICC Line / PIV Line / Intraosseous Line / ART Line / Line / CVC Line / Implanted Port   Name Placement date Placement time Site Days  Periph IV 06/10/22 1223 metacarpal(dorsum of hand), left 22 gauge 06/10/22  1223  metacarpal(dorsum of hand), left  less than 1    ED Labs: Abnormal Labs Reviewed URINALYSIS WITH CULTURE REFLEX      (BH LMW YH) - Abnormal; Notable for the following components:     Result Value  Clarity, UA Turbid (*)   Protein, UA 1+ (*)   Blood, UA 1+ (*)   All other components within normal limits COMPREHENSIVE METABOLIC PANEL - Abnormal; Notable for the following components:  Sodium 134 (*)   BUN 21 (*)   Albumin 2.3 (*)   Alkaline Phosphatase 276 (*)   Aspartate Aminotransferase (AST) 49 (*)   eGFR (Creatinine) 58 (*)   All other components within  normal limits CBC WITH AUTO DIFFERENTIAL - Abnormal; Notable for the following components:  WBC 11.8 (*)   RBC 3.32 (*)   Hemoglobin 10.7 (*)   Hematocrit 32.40 (*)   Neutrophils 75.6 (*)   Lymphocytes 10.4 (*)   Monocytes 12.9 (*)   ANC (Abs Neutrophil Count) 8.90 (*)   Monocyte Absolute Count 1.51 (*)   All other components within normal limits URINE MICROSCOPIC     (BH GH LMW YH) - Abnormal; Notable for the following components:  RBC/HPF, UA 4 (*)   WBC/HPF, UA 9 (*)   All other components within normal limits ED EKG Results: No results found for this or any previous visit.Medications Administered in ED:   Medications acetaminophen (TYLENOL) tablet 1,000 mg (has no administration in time range) lidocaine (LIDODERM) 5 % 1 patch (has no administration in time range) sodium chloride 0.9 % (new bag) bolus 500 mL (0 mLs Intravenous Stopped 06/10/22 1410)  IVF/GTT Running Upon ED Departure? [x]  No	    []  Yes (specify):ED Vital Signs: Vitals:  06/10/22 1131 BP: (!) 155/75 Pulse: 85 Resp: 18 Temp: 97.6 ?F (36.4 ?C) TempSrc: Temporal SpO2: 99% Weight: 53 kg  Patient Belongings:Is there belongings at patient bedside in a labeled patient belongings bag?          [x]  No	    []  YesDid someone taking belongings home?   []  No     [x]  Yes  Who? (specify)                                   ADDITIONAL NOTES: ED RN and Contact number #:         Damaris Schooner RN                                         [phone]

## 2022-06-10 NOTE — ED Notes
2:10 PM Pt. Very unsteady while ambulating with walker. Settled back on stretcher.

## 2022-06-10 NOTE — Other
Admission Note Nursing Sarah Montes is a 88 y.o. female admitted with a chief complaint of bilateral lower back pain. Patient arrived from  home.Patient is   A&O X4 Vitals:  06/10/22 1131 06/10/22 1700 BP: (!) 155/75 (!) 116/59 Pulse: 85 77 Resp: 18 16 Temp: 97.6 ?F (36.4 ?C)  TempSrc: Temporal  SpO2: 99% 98% Weight: 53 kg 53.5 kg Height:  5' 3 (1.6 m) Oxygen therapy Oxygen TherapySpO2: 98 %Device (Oxygen Therapy): room airI have reviewed the patient's current medication orders.     Current Facility-Administered Medications Medication Dose Route Frequency Provider Last Rate Last Admin ? acetaminophen (TYLENOL) tablet 650 mg  650 mg Oral Q6H Melton Alar, MD   650 mg at 06/10/22 1826 ? cyclobenzaprine (FLEXERIL) tablet 5 mg  5 mg Oral Q8H Melton Alar, MD   5 mg at 06/10/22 1710 ? [START ON 06/11/2022] enoxaparin (LOVENOX) syringe 40 mg  40 mg Subcutaneous Daily Melton Alar, MD     ? lidocaine (LIDODERM) 5 % 1 patch  1 patch Transdermal Once Corie Chiquito, APRN   1 patch at 06/10/22 1441 ? [START ON 06/11/2022] lidocaine 4 % topical patch 1 patch  1 patch Transdermal Q24H Melton Alar, MD     ? melatonin tablet 6 mg  6 mg Oral Nightly Melton Alar, MD     ? sodium chloride 0.9 % flush 3 mL  3 mL IV Push Q8H Melton Alar, MD      cyclobenzaprine, ketorolac, sodium chlorideCurrent Facility-Administered Medications Medication Dose Route Frequency Last Rate ? acetaminophen  650 mg Oral Q6H   ? cyclobenzaprine  5 mg Oral Q8H   ? cyclobenzaprine  5 mg Oral Q8H PRN   ? [START ON 06/11/2022]  enoxaparin prophylaxis dosing  40 mg Subcutaneous Daily   ? ketorolac  15 mg IV Push Q6H PRN   ? Lidocaine Patch  1 patch Transdermal Once   ? [START ON 06/11/2022] Lidocaine Patch  1 patch Transdermal Q24H   ? melatonin  6 mg Oral Nightly   ? sodium chloride  3 mL IV Push Q8H   ? sodium chloride  3 mL IV Push PRN for Line Care   .I have reviewed patient valuables Belongings charted in last 7 days: Valuable(s) : Vision; Holiday representative; Purse; Keys; Wallet (06/10/2022  5:00 PM) Comments: Assumed care of pt at 1530. A&OX4. Pt ambulating stand-by-assist with personal cane in room. Home eye drops placed in med room A pharmacy bin on 3rd floor. Pharmacy messaged. Medications administered per MAR. No further needs at this time.See flowsheets, patient education and plan of care for additional information.

## 2022-06-11 ENCOUNTER — Encounter: Admit: 2022-06-11 | Payer: PRIVATE HEALTH INSURANCE | Primary: Family

## 2022-06-11 ENCOUNTER — Ambulatory Visit: Admit: 2022-06-11 | Payer: MEDICARE | Primary: Family

## 2022-06-11 DIAGNOSIS — R443 Hallucinations, unspecified: Secondary | ICD-10-CM

## 2022-06-11 LAB — CBC WITH AUTO DIFFERENTIAL
BKR WAM ABSOLUTE IMMATURE GRANULOCYTES.: 0.02 x 1000/ÂµL (ref 0.00–0.30)
BKR WAM ABSOLUTE LYMPHOCYTE COUNT.: 1.77 x 1000/ÂµL (ref 0.60–3.70)
BKR WAM ABSOLUTE NEUTROPHIL COUNT.: 3.91 x 1000/ÂµL (ref 2.00–7.60)
BKR WAM BASOPHIL ABSOLUTE COUNT.: 0.02 x 1000/ÂµL (ref 0.00–1.00)
BKR WAM BASOPHILS: 0.3 % (ref 0.0–1.4)
BKR WAM EOSINOPHIL ABSOLUTE COUNT.: 0.16 x 1000/ÂµL (ref 0.00–1.00)
BKR WAM EOSINOPHILS: 2.4 % (ref 0.0–5.0)
BKR WAM HEMATOCRIT (2 DEC): 27.9 % — ABNORMAL LOW (ref 35.00–45.00)
BKR WAM HEMOGLOBIN: 8.9 g/dL — ABNORMAL LOW (ref 11.7–15.5)
BKR WAM IMMATURE GRANULOCYTES: 0.3 % (ref 0.0–1.0)
BKR WAM LYMPHOCYTES: 27 % (ref 17.0–50.0)
BKR WAM MCH PG: 31.3 pg (ref 27.0–33.0)
BKR WAM MCHC: 31.9 g/dL (ref 31.0–36.0)
BKR WAM MCV: 98.2 fL (ref 80.0–100.0)
BKR WAM MONOCYTE ABSOLUTE COUNT.: 0.67 x 1000/ÂµL (ref 0.00–1.00)
BKR WAM MONOCYTES: 10.2 % (ref 4.0–12.0)
BKR WAM MPV: 10 fL (ref 8.0–12.0)
BKR WAM NEUTROPHILS: 59.8 % (ref 39.0–72.0)
BKR WAM NUCLEATED RED BLOOD CELLS: 0 % (ref 0.0–1.0)
BKR WAM PLATELETS: 179 x1000/ÂµL (ref 150–420)
BKR WAM RDW-CV: 13.5 % (ref 11.0–15.0)
BKR WAM RED BLOOD CELL COUNT.: 2.84 M/ÂµL — ABNORMAL LOW (ref 4.00–6.00)
BKR WAM WHITE BLOOD CELL COUNT: 6.6 x1000/ÂµL (ref 4.0–11.0)

## 2022-06-11 LAB — COMPREHENSIVE METABOLIC PANEL
BKR ALANINE AMINOTRANSFERASE (ALT): 47 U/L (ref 12–78)
BKR ALBUMIN: 1.8 g/dL — ABNORMAL LOW (ref 3.4–5.0)
BKR ALKALINE PHOSPHATASE: 263 U/L — ABNORMAL HIGH (ref 45–117)
BKR ANION GAP (LM): 5 mmol/L (ref 5–15)
BKR ASPARTATE AMINOTRANSFERASE (AST): 46 U/L — ABNORMAL HIGH (ref 15–37)
BKR BILIRUBIN TOTAL: 0.5 mg/dL (ref 0.2–1.0)
BKR BLOOD UREA NITROGEN: 25 mg/dL — ABNORMAL HIGH (ref 7–18)
BKR CALCIUM: 8.7 mg/dL (ref 8.5–10.1)
BKR CHLORIDE: 109 mmol/L — ABNORMAL HIGH (ref 98–107)
BKR CO2: 26 mmol/L (ref 21–32)
BKR CREATININE: 0.96 mg/dL (ref 0.55–1.02)
BKR EGFR, CREATININE (CKD-EPI 2021): 57 mL/min/{1.73_m2} — ABNORMAL LOW (ref >=60)
BKR GLOBULIN: 3.9 g/dL (ref 2.5–5.0)
BKR GLUCOSE: 91 mg/dL (ref 65–110)
BKR POTASSIUM: 4 mmol/L (ref 3.5–5.1)
BKR PROTEIN TOTAL: 5.7 g/dL — ABNORMAL LOW (ref 6.4–8.2)
BKR SODIUM: 140 mmol/L (ref 136–145)

## 2022-06-11 LAB — PHOSPHORUS     (BH GH L LMW YH): BKR PHOSPHORUS: 4.1 mg/dL (ref 2.5–4.9)

## 2022-06-11 LAB — MAGNESIUM: BKR MAGNESIUM: 2.5 mg/dL (ref 1.6–2.6)

## 2022-06-11 MED ORDER — BRIMONIDINE 0.2 % EYE DROPS
0.2 % | Freq: Two times a day (BID) | OPHTHALMIC | Status: DC
Start: 2022-06-11 — End: 2022-06-13
  Administered 2022-06-11 – 2022-06-12 (×3): 0.2 mL via OPHTHALMIC

## 2022-06-11 MED ORDER — GABAPENTIN 300 MG CAPSULE
300 mg | Freq: Every evening | ORAL | Status: DC
Start: 2022-06-11 — End: 2022-06-13
  Administered 2022-06-12: 300 mg via ORAL

## 2022-06-11 MED ORDER — PREDNISONE 20 MG TABLET
20 mg | Freq: Every day | ORAL | Status: DC
Start: 2022-06-11 — End: 2022-06-13
  Administered 2022-06-11 – 2022-06-12 (×2): 20 mg via ORAL

## 2022-06-11 MED ORDER — VIT A 300 MCG-C 200 MG-E 27 MG-LUTEIN 2 MG AND MINERALS TABLET
300 mcg-200 mg-27 mg-2 mg | Freq: Every day | ORAL | Status: DC
Start: 2022-06-11 — End: 2022-06-13
  Administered 2022-06-11 – 2022-06-12 (×2): 300 mcg-200 mg-27 mg-2 mg via ORAL

## 2022-06-11 MED ORDER — DORZOLAMIDE-TIMOLOL (PF) 2 %-0.5 % EYE DROPS IN A DROPPERETTE
Freq: Two times a day (BID) | OPHTHALMIC | Status: DC
Start: 2022-06-11 — End: 2022-06-13
  Administered 2022-06-11 – 2022-06-12 (×3): via OPHTHALMIC

## 2022-06-11 MED ORDER — CHOLECALCIFEROL (VITAMIN D3) 25 MCG (1,000 UNIT) TABLET
25 mcg (1,000 unit) | Freq: Every day | ORAL | Status: DC
Start: 2022-06-11 — End: 2022-06-13
  Administered 2022-06-12: 13:00:00 25 mcg (1,000 unit) via ORAL

## 2022-06-11 MED ORDER — LATANOPROST 0.005 % EYE DROPS
0.005 % | Freq: Every evening | OPHTHALMIC | Status: DC
Start: 2022-06-11 — End: 2022-06-13
  Administered 2022-06-12: 01:00:00 0.005 mL via OPHTHALMIC

## 2022-06-11 NOTE — Plan of Care
Plan of Care Overview/ Patient Status    Assumed care between 2300-0700. Patient A&Ox4, can be disoriented to place upon waking. Patient's gait unsteady overnight, upgraded to 1 assist with walker from stand by assist. Patient continues to report back pain that is not new, scheduled tylenol and flexeril given with effect pending. Bed alarmed for safety, patient can be forgetful to call for assistance, patient instructed on use of call bell and side rail call buttons multiple times, will continue to reassess patient's needs through rest of this shift.

## 2022-06-11 NOTE — Progress Notes
General Medicine Progress NotePatient Data:  Patient Name: Sarah Montes Age: 88 y.o. DOB: 1931-08-22	 MRN: [id number]	 Hospital Medicine Progress NoteMohammad Mudassar Welton Flakes, MDSubjective: Subjective:  No acute events overnight Sitting her lower back pain is improving, but still has persistent pain in the neckWas able to work with PT/OT this morning14 point review of systems otherwise negative except as mentioned aboveReview of Allergies/Meds/Hx: I have reviewed the patient's allergies, prior to admission meds, past medical/surgical, family and social hx. Inpatient Medications:Scheduled Meds:Current Facility-Administered Medications Medication Dose Route Frequency Provider Last Rate Last Admin ? acetaminophen (TYLENOL) tablet 650 mg  650 mg Oral Q6H Melton Alar, MD   650 mg at 06/11/22 0640 ? brimonidine (ALPHAGAN) 0.2 % ophthalmic solution 1 drop  1 drop Both Eyes BID Melton Alar, MD     ? Melene Muller ON 06/12/2022] cholecalciferol (vitamin D3) tablet 1,000 Units  1,000 Units Oral Daily Melton Alar, MD     ? dorzolamide-timolol (PF) (COSOPT PF) 2-0.5 % ophthalmic solution in dropperette 1 drop  1 drop Left Eye BID Melton Alar, MD     ? enoxaparin (LOVENOX) syringe 40 mg  40 mg Subcutaneous Daily Melton Alar, MD   40 mg at 06/11/22 0915 ? gabapentin (NEURONTIN) capsule 300 mg  300 mg Oral Nightly Melton Alar, MD     ? latanoprost (XALATAN) 0.005 % ophthalmic solution 1 drop  1 drop Both Eyes Nightly Melton Alar, MD     ? lidocaine 4 % topical patch 1 patch  1 patch Transdermal Q24H Melton Alar, MD     ? melatonin tablet 6 mg  6 mg Oral Nightly Melton Alar, MD   6 mg at 06/10/22 2136 ? predniSONE (DELTASONE) tablet 60 mg  60 mg Oral Daily Melton Alar, MD     ? sodium chloride 0.9 % flush 3 mL  3 mL IV Push Q8H Melton Alar, MD   3 mL at 06/10/22 2137 ? vit A,C and E-lutein-minerals (OCUVITE) 300 mcg-200 mg-27 mg-2 mg 1 tablet  1 tablet Oral Daily Melton Alar, MD     Continuous Infusions:PRN Meds:.cyclobenzaprine, ketorolac, sodium chlorideObjective: Vitals:Last 24 hours: Temp:  [97.5 ?F (36.4 ?C)-98.1 ?F (36.7 ?C)] 97.5 ?F (36.4 ?C)Pulse:  [61-85] 61Resp:  [1-18] 16BP: (103-155)/(57-75) 110/68SpO2:  [96 %-99 %] 98 %I/O's:I/O last 3 completed shifts:In: 240 [P.O.:240]Out: - No intake/output data recorded.Height, Weight, BSA, BMIWeight: 56.2 kgHeight: 5' 3 (160 cm)  Physical ExamConstitutional:     Appearance: Normal appearance. HENT:    Head: Normocephalic and atraumatic.    Mouth/Throat:    Mouth: Mucous membranes are moist.    Pharynx: Oropharynx is clear. Eyes:    General: No scleral icterus.Cardiovascular:    Rate and Rhythm: Normal rate and regular rhythm.    Heart sounds: No murmur heard.   No friction rub. No gallop. Pulmonary:    Effort: Pulmonary effort is normal. No respiratory distress.    Breath sounds: Normal breath sounds. No stridor. No wheezing, rhonchi or rales. Abdominal:    General: Bowel sounds are normal. There is no distension.    Palpations: Abdomen is soft. There is no mass.    Tenderness: There is no abdominal tenderness. There is no guarding or rebound. Musculoskeletal:    Cervical back: Normal range of motion and neck supple.    Comments: Range of motion restricted at neck.  TTP in the left lumbar spinal area Skin:   General: Skin is warm and dry. Neurological:  General: No focal deficit present.    Mental Status: She is alert and oriented to person, place, and time. Labs:CBCRecent Labs Lab 08/25/231243 08/26/230531 WBC 11.8* 6.6 HGB 10.7* 8.9* HCT 32.40* 27.90* PLT 200 179 LFTsRecent Labs Lab 08/25/231243 08/26/230531 ALT 52 47 AST 49* 46* ALKPHOS 276* 263* BILITOT 0.5 0.5 LIPASENo results for input(s): LIPASE in the last 168 hours. BMPRecent Labs Lab 08/25/231243 08/26/230531 NA 134* 140 K 3.8 4.0 CL 102 109* CO2 23 26 BUN 21* 25* CREATININE 0.95 0.96 CALCIUM 9.1 8.7 MG  --  2.5 PHOS  --  4.1 CoagsNo results for input(s): INR, PTT in the last 168 hours. Cardiac EnzymesRecent Labs Lab 08/25/231243 CKTOTAL 66  pBNPNo components found for: PROBNP LipidsLab Results Component Value Date  CHOL 204 (H) 04/02/2021  HDL 107 (H) 04/02/2021  LDL 81 04/02/2021  TRIG 93 04/02/2021  A1CNo results found for: HGBA1C Other Labs No results for input(s): TSH in the last 168 hours.GlucoseRecent Labs Lab 08/25/231243 08/26/230531 GLU 110 91  MicrobiologyBlood cultures: No results found for: LABBLOORespiratory cultures:@LASTMICRO (LAB3221:5)@Wound  cultures:@LASTMICRO (LAB2880:5)@ No results found for: CRPNo results found for: ESRSED Diagnostics:I have reviewed the patient's Radiology report(s) within the last 48 hrs.MRI Lumbar Spine wo IV ContrastResult Date: 8/25/2023Multilevel degenerative disc changes to include multilevel spinal canal and neural foraminal stenosis. Question impingement of exiting left L3 nerve. Suspect impingement of both L5 nerves at the L4-5 level. Correlate clinically. Reported and signed by:  Herma Ard, MD [place] Cervical Spine wo IV ContrastResult Date: 06/10/2022  Degenerative change. No evidence of fracture or dislocation. Coronal and sagittal reformatted images confirm the above findings. Reported and signed by:  Anne Shutter, MD Pulcifer Head wo IV ContrastResult Date: 8/25/2023Diffuse age-related cerebral atrophy. Periventricular white matter change, likely due to chronic small vessel ischemic disease. Reported and signed by:  Anne Shutter, MD Assessment: Sarah Montes is a 88 y.o. year old woman with a PMH significant for glaucoma and urinary incontinence, who presented with a three-week history of lower back pain without any radiation, which recurred again about 5 days ago with worsening over 2 days leading up to admissionPlan: #Low back painInitially improved with prednisone and tramadol - but had hallucinations with tramadol, hence Holding off narcoticsContinue Tylenol scheduled every 6 hours.  Toradol/Flexeril IV p.r.n..  Continue lidocaine patchMRI concerning for impingement of L3 nerve on the left and L5 nerve bilaterallyDiscussed remotely with Neurosurgery (Dr Dayle Points) - MRI findings are likely chronic and would not need acute intervention, will need follow-up after dischargeStart prednisone 60 milligrams daily - will need taper.  Gabapentin 300 milligrams at nighttimePT/OT#Neck pain Still has restricted range of motion with pain Check MRI cervical spine to further?#Elevated alkaline phosphatase Also noted mild elevation in AST with normal ALT and bilirubin GGT normal?#Urinary incontinence Says this has been ongoing for many years and has had previous bladder surgeries Appears to be at baseline.  Continue to monitorDiet: Diet RegularVTE prophylaxis: LovenoxCode status: Full CodeCare/plan discussed with patient at the bedside, who understands and agrees with the above plan.I have confirmed that the patient's advanced care plan is present, code status is documented, or surrogate decision maker is listed in the patient's medical record.I have utilized all available immediate resources to obtain, update, or review the patient's current medications.Signed:Melton Alar, MDBeeper: [phone]/26/202311:00This report was created in part from templates and voice recognition software. Typographical and minor dictation errors may be present.

## 2022-06-11 NOTE — Plan of Care
Inpatient Physical Therapy Evaluation IP Adult PT Eval/Treat - 06/11/22 5284    Date of Visit / Treatment  Date of Visit / Treatment 06/11/22   Note Type Evaluation   Total Treatment Time 32    General Information  Pertinent History Of Current Problem Pt is an 88 y.o. female who presented on 8/25 with increased LBP and neck pain after a fall at home. Pt reporting back pain has been increasing for past 3 weeks however got to 11/10 yesterday after falling. Pt has significant PMH of glaucoma, macular degeneration, and frequent falls at home.   Subjective Pt was willing to participate in PT evaluation this morning.   Referring Physician  Welton Flakes, MD   Other Providers OT   General Observations pt upright in bed   Precautions/Limitations Fall Precautions;Bed alarm;Chair alarm   Fall History yes, reason for admission   pt intermittently confused about last fall, reporting at times she has not fallen in over 1 year however reporting oh yeah I did fall recently once reminded of why she was in hospital   Prior Level of Functioning/Social History  Prior Level of Function independent with assistive device;independent with ADLs   Patient resides with: Alone   Type of Home 2 story house   Home Setup Steps to enter with rail(s);Full flight present;Bed/bath upstairs   Bathroom Setup Tub/shower;Standard height toilet   Equipment Utilized Prior to Admission/Treatment Cane;Rollator   Additional Comments Pt reporting using SPC until a few weeks ago when LBP began getting worse, pt reporting then beginning to use rollator at home.    Pain/Comfort  Location #1 - PreTreatment Rating (Numbers Scale) 1/10   Posttreatment Rating (Numbers Scale) 5/10   Pain Location - Side Bilateral   Pain Location - Orientation lower   Pain Location back   Pain Comment (Pre/Post Treatment Pain) Pt reporting minimall annoyance without movement, 4/10 pain in LB while standing for ADL's in bathroom    Patient Coping  Observed Emotional State calm;cooperative    Cognition  Overall Cognitive Status UTA   Orientation Level Oriented X4   Level of Consciousness alert   Following Commands Follows one step commands without difficulty    Range of Motion  Range of Motion Examination bilateral lower extremity ROM was WNL    Musculoskeletal  LLE Muscle Strength Grading 4-->active movement against gravity and resistance   4-/5 grossly for BLE  RLE Muscle Strength Grading 4-->active movement against gravity and resistance   4-/5 grossly for BLE   Sensory Assessment  Sensory Tests Results No sensory impairment noted   Sensory Assessment Comments WFL for light touch and dermatomes    Skin Assessment  Skin Assessment See Nursing Documentation    Posture, Head/Trunk Alignment  Posture, Head/Trunk Alignment Forward head;Rounded shoulders    Balance  Sitting Balance: Static  GOOD-    Maintains static position against minimal resistance with no Assistive Device   Sitting Balance: Dynamic  FAIR+    Performs dynamic activities through full range with Supervision   Standing Balance: Static FAIR-      Contact Guard to maintain static position with no Assistive Device   Standing Balance: Dynamic  POOR+   Moves through 1/2 range with minimal assist to right self   Balance Assist Device Rolling walker    BADL Retraining  BADL Retraining Detailed Documentation Toilet Training;Grooming    Toilet Training  Independence/Assistance Level Contact guard   Assistive Device Commode;Rolling walker   Hygiene Performance contact guard   Clothing Management  minimal assist   Toilet Training Comments Pt having LOB while attempting to don underwear after toileting, requiring minAX1 for correction to decreased fall risk    Grooming  Independence/Assistance Level Contact guard;Set-up required   Assistive Device No device   Grooming Activities brush teeth;wash hands;standing at sink    Bed Mobility  Symptoms Noted During/After Treatment increased pain   Supine-to-Sit Independence/Assistance Level Contact guard   Supine-to-Sit Assist Device No device    Sit-Stand Transfer Training  Symptoms Noted During/After Treatment (Sit-to-Stand Transfer Training) increased pain   Sit-to-Stand Transfer Independence/Assistance Level Contact guard;Minimum assist;Assist of 1;Verbal cues   Sit-to-Stand Transfer Assist Device Rolling walker   Stand-to-Sit Transfer Independence/Assistance Level Contact guard;Minimum assist;Assist of 1;Verbal cues   Stand-to-Sit Transfer Assist Device Rolling walker   Transfer Safety Analysis Concerns decreased balance during turns;losing balance backward;decreased sequencing ability;decreased step length;cues for hand placement;decreased weight-shifting ability   Transfer Safety Analysis Impairments impaired balance;pain;decreased strength;impaired postural control   Transfers were limited by: pain   Sit-Stand Transfer Comments performed from bed and toilet, increased pain and LOB during both performances    Gait Training  Symptoms Noted During/After Treatment  increased pain   Independence/Assistance Level  Contact guard;Verbal cues   Assistive Device  Rolling walker   Gait Distance 20 feet   Gait Pattern Analysis step to gait   Gait Analysis Deviations decreased cadence;decreased step length;antalgic gait   Gait Analysis Impairments impaired balance;pain;impaired postural control;decreased strength;decreased sequencing ability   Ambulation distance was limited by: pain    Handoff Documentation  Handoff Patient in chair;Chair alarm   Handoff Comments RN in room    Endurance  Endurance Comments fair    PT- AM-PAC - Basic Mobility Screen- How much help from another person do you currently need.....  Turning from your back to your side while in a a flat bed without using rails? 3 - A Little - Requires a little help (supervision, minimal assistance). Can use assistive devices.   Moving from lying on your back to sitting on the side of a flat bed without using bed rails? 3 - A Little - Requires a little help (supervision, minimal assistance). Can use assistive devices.   Moving to and from a bed to a chair (including a wheelchair)? 3 - A Little - Requires a little help (supervision, minimal assistance). Can use assistive devices.   Standing up from a chair using your arms(e.g., wheelchair or bedside chair)? 3 - A Little - Requires a little help (supervision, minimal assistance). Can use assistive devices.   To walk in a hospital room? 3 - A Little - Requires a little help (supervision, minimal assistance). Can use assistive devices.   Climbing 3-5 steps with a railing? 1 - Total - Requires total assistance or cannot do it at all.   AMPAC Mobility Score 16   TARGET Highest Level of Mobility Mobility Level 5, Stand for 1 minute    Clinical Impression  Initial Assessment Pt is a 88 y.o. female who presented to the [facility] ED on 8/25 after a fall at home resulting in increased LBP and neck pain. Pt was A+O and was able to answer most PLOF questions however foggy on some questions. Pt lives independently in 2 story house with bedroom/bathroom upstairs however per reports increased falls at home recently. [facility] when her back pain worsened, subsequent fall on 8/25 resulting in 11/10 LBP. Pt was able to perform bed mobility with minimal  increased LBP, maintained static sitting at EOB without difficulty and was able to perform ROM/strength testing without LOB while sitting at EOB. Pt was able to perform sit to stand with some lateral deviation however was able to self correct. Pt was able to ambulate to bathroom with RW and CGA, CGA required throughout toileting. Pt having LOB with minAx1 required while sit to stand from toilet, pt reporting not d/t pain. Pt requiring assistance with dressing after toileting. Pt was able to maintain static standing at sink while performing ADL's. Pt then ambulating ~20 feet in hallway with RW and CGA/minAx1. Increased VC for gait sequencing throughout and safe hand placement. Pt left in recliner, RN in room at time of PT exit. At this time, d/t pt living independently with increased falls at home/fall risk, intermittent confusion, and full flight of stairs present at home, PT rec d/c to SNF for STR for balance training, fall prevention, pain management, and generalized strengthening. PT will continue to follow as needed.    Frequency/Equipment Recommendations  PT Frequency 5x per week    Planned Treatment / Interventions  Plan for Next Visit progress ambulation, balance training, stair trial if d/c home (full flight)    PT Discharge Summary  Physical Therapy Disposition Recommendation Short Term Rehab     Problem: Physical Therapy GoalsGoal: Rehab Plan of Care Review, PTOutcome: Initial problem identificationGoal: Rehab Individuality and Mutuality, PTOutcome: Initial problem identificationGoal: Rehab Discharge Needs Assessment, PTOutcome: Initial problem identificationGoal: Physical Therapy GoalsDescription: Pt will perform bed mobility independently in 5 visits for decreased risk of skin breakdown.Pt will perform sit<>stand independently in 5 visits for ease of performing toilet transfersPt will ambulate 150 feet with SPC independently in 5 visits for safe home distance management.Improve AMPAC to 22 for improved functional mobility.Pt will navigate 12 stairs in 5 visits with railing and SPC to safely navigate home.Outcome: Initial problem identification Plan of Care Overview/ Patient Status

## 2022-06-11 NOTE — Utilization Review (ED)
UM Status: UR Reviewed-Continue Observation Services Obs for back pain. MRI shows stenosis with nerve root impingement. Independent at baseline, requiring assist x 1 with RW. Will have PT eval. May need STR, CM consulted and patient aware she will have to private pay. VSS. Patient to remain in observation status at this time of review. UM to continue to follow.

## 2022-06-11 NOTE — Plan of Care
Inpatient Occupational Therapy EvaluationDefault Flowsheet Data (most recent)   IP Adult OT Eval/Treat - 06/11/22 0920    Date of Visit / Treatment  Date of Visit / Treatment 06/11/22   Note Type Evaluation   Total Treatment Time 25    General Information  Pertinent History Of Current Problem Per chart: Pt is an 88 y.o. female presenting to [facility] ED with c/o low back pain. PMHx includes glaucoma, urinary incontinence and falls at home. Pt reports increased back pain over the last week resulting in a fall in her bedroom, hitting her R shoulder, R hip and head. Lumbar MRI shows moderate-severe spinal stenosis at L4-L5 level.   Subjective Pt agreeable to OT consult   Other Providers PT   General Observations supine in bed, HOB raised, PIV intact; R hand brace donned for upport/chronic pain d/t arthritis   Precautions/Limitations Fall Precautions   Fall History yes, in the past week    Prior Level of Functioning/Social History  Prior Level of Function independent with assistive device;independent with ADLs   Patient resides with: Alone   Type of Home Multi-level house   Home Setup Steps to enter with rail(s);Full flight present;1/2 bath on main level;Bed/bath upstairs   Bathroom Setup Standard height toilet;Tub/shower   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    Patient Coping Observed Emotional State calm;cooperative    Cognition  Overall Cognitive Status Unable to assess   Orientation Level Oriented X4   Level of Consciousness alert   Following Commands Follows one step commands without difficulty   Personal Safety / Judgment Fall risk;Decreased recognition / insight of own deficits   Short Term Memory Decreased recall of recent events   Cognition Comments Inconsistent recollection of recent events    Range of Motion  Range of Motion Examination bilateral upper extremity ROM was WNL    Musculoskeletal  LUE Muscle Strength Grading 4-->active movement against gravity and resistance   RUE Muscle Strength Grading 4-->active movement against gravity and resistance    Coordination  Opposition WFL - Within Functional Limits    Sensory Assessment  Sensory Tests Results No sensory impairment noted    Skin Assessment  Skin Assessment See Nursing Documentation    Posture, Head/Trunk Alignment  Posture, Head/Trunk Alignment Forward head;Rounded shoulders    Balance  Sitting Balance: Static  GOOD-    Maintains static position against minimal resistance with no Assistive Device   Sitting Balance: Dynamic  FAIR+    Performs dynamic activities through full range with Supervision   Standing Balance: Static FAIR-      Contact Guard to maintain static position with no Assistive Device   Standing Balance: Dynamic  POOR+   Moves through 1/2 range with minimal assist to right self   Balance Assist Device Rolling walker    BADL Retraining  BADL Retraining Detailed Documentation Toilet Training;Grooming    Toilet Training  Independence/Assistance Level Contact guard   Assistive  Device Commode;Rolling walker   Hygiene Performance contact guard   Clothing Management minimal assist   Toilet Training Comments LOB noted as pt stands to manage underwear over B/L hips, min Ax1 to correct. CGAx1 using RW to perform toilet tx Grooming  Independence/Assistance Level Minimum assist;Assist of 1   Assistive Device No device   Grooming Activities brush teeth;comb hair;standing at sink   Grooming Comments able to perform standing level oral hygiene sink side using RW for unilateral UE support, min A to reach items on shelf; Pt performs seated level hair combing with set up assist     AM-PAC - Daily Activity IP Short Form  Help needed from another person putting on/taking off regular lower body clothing 2 - A Lot   Help needed from another person for bathing (incl. washing, rinsing, drying) 2 - A Lot   Help needed from another person for toileting (incl. using toilet, bedpan, urinal) 3 - A Little   Help needed from another person putting on/taking off regular upper body clothing 3 - A Little   Help needed from another person taking care of personal grooming such as brushing teeth 3 - A Little   Help needed from another person eating meals 4 - None   AM-PAC Daily Activity Raw Score (Total of rows above) 17   CMS Score (based on Raw Score - with G Code) 17 - 50.11% impaired      (G Code - CK)    Bed Mobility  Symptoms Noted During/After Treatment increased pain   Supine-to-Sit Independence/Assistance Level Contact guard   Supine-to-Sit Assist Device No device   Bed Mobility, Impairments pain    Sit-Stand Transfer Training  Symptoms Noted During/After Treatment (Sit-to-Stand Transfer Training) increased pain   Sit-to-Stand Transfer Independence/Assistance Level Contact guard;Minimum assist;Assist of 1;Verbal cues   Sit-to-Stand Transfer Assist Device Rolling walker   Stand-to-Sit Transfer Independence/Assistance Level Contact guard;Minimum assist;Assist of 1;Verbal cues   Stand-to-Sit Transfer Assist Device Rolling walker   Transfer Safety Analysis Concerns decreased balance during turns;losing balance backward;decreased sequencing ability;decreased step length;decreased weight-shifting ability;cues for hand placement   Transfer Safety Analysis Impairments pain;impaired postural control;impaired balance;decreased strength   Transfers were limited by: pain    Gait Training  Symptoms Noted During/After Treatment  increased pain   Independence/Assistance Level  Contact guard;Verbal cues   Assistive Device  Rolling walker   Gait Distance 20 feet   Gait Training Comments cues for safe use of RW    Handoff Documentation  Handoff Patient in chair;Chair alarm;Patient instructed to call nursing for mobility;Discussed with nursing   Handoff Comments RN in room    Endurance  Endurance Comments fair    Clinical Impression  Initial Assessment Pt seen for OT consult. Performs functional mobility with CGA-min A using RW with cues for safety, sequencing and hand placement. Pt able to perform seated level ADLs with s/u assist, standing level ADLs with CGA-min A using RW for unilateral UE support due to increased pain. LOB noted during sit>stand from commode during toileting, min Ax1 to correct. Pt is limited by increased pain, impaired balance, decreased strength, decreased functional activity tolerance and would benefit from continued inpatient OT to maximize safety and (I) in BADLs/MRADLs. Recommend STR upon d/c due to fall risk and aforementioned limitations in order to facilitate safe d/c home.    Patient/Family Stated Goals  Patient/Family Stated Goal(s) return home;return to prior level of functioning    Frequency/Equipment Recommendations  OT Frequency 3x per week  OT Recommendations for Inpatient Admission  Activity/Level of Assist assist of 1;with rolling walker;contact guard;minimum assistance   ADL Recommendations assist of 1;with rolling walker;assist to bathroom    Planned Treatment / Interventions  Plan for Next Visit ADLs    OT Discharge Summary  Disposition Recommendation Short Term Rehab     Problem: Occupational Therapy GoalsGoal: Rehab Plan of Care Review, OTOutcome: Initial problem identificationGoal: Rehab Individuality and Mutuality, OTOutcome: Initial problem identificationGoal: Rehab Discharge Needs Assessment, OTOutcome: Initial problem identificationGoal: Occupational Therapy GoalsDescription: Pt will safely perform toilet tx with S using RWPt will safely perform LB dressing with S using least restrictive adaptive equipment as needed to reduce pain and increase safety during ADL routinePt will increase dynamic standing balance to fair- in order to reduce risk of falls during ADL routineOutcome: Initial problem identification Plan of Care Overview/ Patient Status

## 2022-06-11 NOTE — Plan of Care
Plan of Care Overview/ Patient Status    Assumed care of Pt at 0700. MRI at 1140. Pain to neck and lower back. IV access and safety maintained. No further needs at this time. Problem: Adult Inpatient Plan of CareGoal: Readiness for Transition of CareOutcome: Interventions implemented as appropriate Problem: Skin Injury Risk IncreasedGoal: Skin Health and IntegrityOutcome: Interventions implemented as appropriate Problem: Fall Injury RiskGoal: Absence of Fall and Fall-Related InjuryOutcome: Interventions implemented as appropriate Problem: Wound Healing ProgressionGoal: Optimal Wound HealingOutcome: Interventions implemented as appropriate

## 2022-06-11 NOTE — Plan of Care
Plan of Care Overview/ Patient Status    Assumed care of pt between 1900-2300. Pt A/O x 4, HOH with HA at home, glasses at bedside. Pt c/o moderate-severe neck and lower back pain. Prn toradol and heat pack to back of neck given x 1 with moderate effect. Around the clock analgesia per [facility], results pending. Dressing to R elbow changed, c/d/I. Pt up to bathroom with assist x 1 and RW. Bed alarm on, call light in reach, safety maintained. Problem: Adult Inpatient Plan of CareGoal: Readiness for Transition of CareOutcome: Interventions implemented as appropriate Problem: Skin Injury Risk IncreasedGoal: Skin Health and IntegrityOutcome: Interventions implemented as appropriate Problem: Fall Injury RiskGoal: Absence of Fall and Fall-Related InjuryOutcome: Interventions implemented as appropriate Problem: Wound Healing ProgressionGoal: Optimal Wound HealingOutcome: Interventions implemented as appropriate

## 2022-06-12 DIAGNOSIS — Z803 Family history of malignant neoplasm of breast: Secondary | ICD-10-CM

## 2022-06-12 DIAGNOSIS — I10 Essential (primary) hypertension: Secondary | ICD-10-CM

## 2022-06-12 DIAGNOSIS — M545 Low back pain, unspecified: Secondary | ICD-10-CM

## 2022-06-12 DIAGNOSIS — Z79899 Other long term (current) drug therapy: Secondary | ICD-10-CM

## 2022-06-12 DIAGNOSIS — Z888 Allergy status to other drugs, medicaments and biological substances status: Secondary | ICD-10-CM

## 2022-06-12 DIAGNOSIS — Z885 Allergy status to narcotic agent status: Secondary | ICD-10-CM

## 2022-06-12 DIAGNOSIS — M542 Cervicalgia: Secondary | ICD-10-CM

## 2022-06-12 DIAGNOSIS — Z880 Allergy status to penicillin: Secondary | ICD-10-CM

## 2022-06-12 DIAGNOSIS — E785 Hyperlipidemia, unspecified: Secondary | ICD-10-CM

## 2022-06-12 DIAGNOSIS — R441 Visual hallucinations: Secondary | ICD-10-CM

## 2022-06-12 LAB — COMPREHENSIVE METABOLIC PANEL
BKR ALANINE AMINOTRANSFERASE (ALT): 43 U/L (ref 12–78)
BKR ALBUMIN: 1.7 g/dL — ABNORMAL LOW (ref 3.4–5.0)
BKR ALKALINE PHOSPHATASE: 271 U/L — ABNORMAL HIGH (ref 45–117)
BKR ANION GAP (LM): 6 mmol/L (ref 5–15)
BKR ASPARTATE AMINOTRANSFERASE (AST): 32 U/L (ref 15–37)
BKR BILIRUBIN TOTAL: 0.3 mg/dL (ref 0.2–1.0)
BKR BLOOD UREA NITROGEN: 28 mg/dL — ABNORMAL HIGH (ref 7–18)
BKR CALCIUM: 8.9 mg/dL (ref 8.5–10.1)
BKR CHLORIDE: 111 mmol/L — ABNORMAL HIGH (ref 98–107)
BKR CO2: 24 mmol/L (ref 21–32)
BKR CREATININE: 0.87 mg/dL (ref 0.55–1.02)
BKR EGFR, CREATININE (CKD-EPI 2021): >60 mL/min/{1.73_m2} (ref >=60)
BKR GLOBULIN: 4.2 g/dL (ref 2.5–5.0)
BKR GLUCOSE: 127 mg/dL — ABNORMAL HIGH (ref 65–110)
BKR POTASSIUM: 4.5 mmol/L (ref 3.5–5.1)
BKR PROTEIN TOTAL: 5.9 g/dL — ABNORMAL LOW (ref 6.4–8.2)
BKR SODIUM: 141 mmol/L (ref 136–145)

## 2022-06-12 LAB — URINE CULTURE

## 2022-06-12 MED ORDER — PREDNISONE 10 MG TABLET
10 mg | ORAL_TABLET | ORAL | 1 refills | Status: AC
Start: 2022-06-12 — End: ?

## 2022-06-12 MED ORDER — LIDOCAINE 4 % TOPICAL PATCH
4 % | MEDICATED_PATCH | TRANSDERMAL | 1 refills | Status: AC
Start: 2022-06-12 — End: 2022-10-13

## 2022-06-12 MED ORDER — CYCLOBENZAPRINE 5 MG TABLET
5 mg | ORAL_TABLET | Freq: Three times a day (TID) | ORAL | 1 refills | Status: AC | PRN
Start: 2022-06-12 — End: ?

## 2022-06-12 MED ORDER — GABAPENTIN 300 MG CAPSULE
300 mg | ORAL_CAPSULE | Freq: Every evening | ORAL | 2 refills | Status: AC
Start: 2022-06-12 — End: 2022-10-13

## 2022-06-12 MED ORDER — ACETAMINOPHEN 325 MG TABLET
325 mg | ORAL_TABLET | Freq: Four times a day (QID) | ORAL | 1 refills | Status: AC | PRN
Start: 2022-06-12 — End: ?

## 2022-06-12 NOTE — Plan of Care
Inpatient Physical Therapy Progress Note IP Adult PT Eval/Treat - 06/12/22 1332    Date of Visit / Treatment  Date of Visit / Treatment 06/12/22   Note Type Progress Note    General Information  Subjective Pt agreeable to therapy, stated she wanted to move and not bedbound herself. Pt stated her back was better and she hopes she never feels the pain she did again.   General Observations Pt presented in recliner and stating she did not need assist out of bed.   Precautions/Limitations Fall Precautions    Pain/Comfort  Pain Comment (Pre/Post Treatment Pain) no c/o pain today but stated her neck felt stiff    Vision/ Hearing  Hearing difficulties / Use of hearing aids [facility]    Skin Assessment  Skin Assessment See Nursing Documentation    Balance  Sitting Balance: Static  GOOD-    Maintains static position against minimal resistance with no Assistive Device   Sitting Balance: Dynamic  FAIR      Performs dynamic activities through 75% range Contact Guard or partial range (50-75%) with Supervision   Standing Balance: Static FAIR       Maintains static position without assist or device, may require Supervision or Verbal Cues (<2 minutes)   Standing Balance: Dynamic  FAIR      Performs dynamic activities through 75% range Contact Guard or partial range (50-75%) with Supervision   Balance Assist Device Rolling walker    Sit-Stand Transfer Training  Sit-to-Stand Transfer Independence/Assistance Level Supervision;Contact guard   Sit-to-Stand Transfer Assist Device Rolling walker;Gait belt   Stand-to-Sit Transfer Independence/Assistance Level Supervision   Stand-to-Sit Transfer Assist Device Rolling walker;Gait belt   Transfer Safety Analysis Concerns cues for hand placement    Gait Training  Symptoms Noted During/After Treatment  fatigue   Independence/Assistance Level  Supervision;Contact guard;Verbal cues   progressed to S  Assistive Device Rolling walker;Gait belt   Gait Distance --   23' then following BR use, 100'  Gait Pattern Analysis step through gait   Gait Analysis Deviations decreased cadence;decreased step length   Gait Analysis Impairments impaired balance;decreased sequencing ability   Ambulation distance was limited by: fatigue   Gait Training Comments Pt had no overt LOB, however, sometimes makes decisions to reach for object on table or go to transfer without keeping RW with her and required education/cues for safety. Pt had cues to take her time when turning and not to pick up the walker for improved safety.    Stair Performance  Number of Stairs 3 steps up and down=6   Stair Railings present on both sides   Independence/Assistance Level  Supervision   Assistive Device Rail   Technique step to step   Stair Performance Comment good functional strength, safe with rails and S    Handoff Documentation  Handoff Patient instructed to call nursing for mobility;Discussed with nursing;Patient in chair;Chair alarm   needs met, w/ RN   Endurance  Endurance Comments fair+ to good-    PT- AM-PAC - Basic Mobility Screen- How much help from another person do you currently need.....  Turning from your back to your side while in a a flat bed without using rails? 3 - A Little - Requires a little help (supervision, minimal assistance). Can use assistive devices.   Moving from lying on your back to sitting on the side of a flat bed without using bed rails? 3 - A Little - Requires a little help (supervision, minimal assistance). Can use assistive devices.  Moving to and from a bed to a chair (including a wheelchair)? 3 - A Little - Requires a little help (supervision, minimal assistance). Can use assistive devices.   Standing up from a chair using your arms(e.g., wheelchair or bedside chair)? 3 - A Little - Requires a little help (supervision, minimal assistance). Can use assistive devices.   To walk in a hospital room? 3 - A Little - Requires a little help (supervision, minimal assistance). Can use assistive devices.   Climbing 3-5 steps with a railing? 3 - A Little - Requires a little help (supervision, minimal assistance). Can use assistive devices.   AMPAC Mobility Score 18   TARGET Highest Level of Mobility Mobility Level 6, Walk 10+steps   ACTUAL Highest Level of Mobility Mobility Level 7, Walk 25+ feet    Therapeutic Exercise  Therapeutic Exercise Comments Pt asked about exercises in bed or chair and instructed and ed on B LAQs, ankle pumps and glute sets 2X 10/day as tolerated to prevent deconditoning and CS AROM to tolerance to decrease stiffness pt reported and asking for relief.    Clinical Impression  Follow up Assessment Pt tolerated increased ambulation distance and progressed to supervision/decreased assist. Pt refusing STR previous rec and made some improvements today due to decreased pain. Pt makes quick movements at times and was educated on pacing self to decrease fall risk. Pt d/c rec since refusing STR is home with 24/7 S and home PT.    Frequency/Equipment Recommendations  PT Frequency 5x per week    PT Recommendations for Inpatient Admission  Activity/Level of Assist assist of 1;ambulate;with rolling walker;supervision;gait belt    Planned Treatment / Interventions  Training Treatment / Interventions Balance / Gait Training;Endurance Training;Functional Systems analyst    PT Discharge Summary  Physical Therapy Disposition Recommendation --   Pt refusing rehab but stated she will have someone overnight as well with her for 2 weeks. Since pt refusing STR, rec is home with 24/7 supervision and home PT.    Problem: Physical Therapy GoalsGoal: Rehab Plan of Care Review, PTOutcome: Interventions implemented as appropriateGoal: Rehab Individuality and Mutuality, PTOutcome: Interventions implemented as appropriateGoal: Rehab Discharge Needs Assessment, PTOutcome: Interventions implemented as appropriateGoal: Physical Therapy GoalsDescription: Pt will perform bed mobility independently in 5 visits for decreased risk of skin breakdown.Pt will perform sit<>stand independently in 5 visits for ease of performing toilet transfersPt will ambulate 150 feet with SPC independently in 5 visits for safe home distance management.Improve AMPAC to 22 for improved functional mobility.Pt will navigate 12 stairs in 5 visits with railing and SPC to safely navigate home.Outcome: Interventions implemented as appropriate Plan of Care Overview/ Patient Status

## 2022-06-12 NOTE — Utilization Review (ED)
UM Status: UR Reviewed-Continue Observation ServicesLower back pain on scheduled analgesics, lidocaine patch and PRN Toradol. Chronic findings on MRI will require OP follow up. UR to follow for further MD recommendations.12:14 PMDiscussed case with Melton Alar, MD discharge anticipated for today.

## 2022-06-12 NOTE — Plan of Care
Plan of Care Overview/ Patient Status    Problem: Adult Inpatient Plan of CareGoal: Readiness for Transition of CareOutcome: Outcome(s) achievedPatient to discharge home this date. This CM able to secure home care with [facility]. SN,PT and OT. Left detailed VM for son to inform him of VNA services. Team made aware. Case Management Plan  Flowsheet Row Most Recent Value Discharge Planning  Patient/Patient Representative goals/treatment preferences for discharge are:  Home with skilled care Patient/Patient Representative was presented with a list of facilities, agencies and/or dme providers and Referral(s) placed for: Homecare services Home Health Care Services Required Nursing, Physical Therapy, Occupational Therapy [facility] Mode of Transportation  Private car  (add comment for special considerations) Patient accompanied by Family CM D/C Readiness  PASRR completed and approved N/A Authorization number obtained, if required N/A Is there a 3 day INPATIENT Qualifying stay for Medicare Patients? N/A Medicare IM- signed, dated, timed and scanned, if required N/A DME Authorized/Delivered N/A No needs identified/ follow up with PCP/MD N/A Pri Completed and Accepted  N/A Is the destination address correct on the W10 N/A Finalized Plan  Expected Discharge Date 06/12/22  Ulla Gallo RN [facility]

## 2022-06-12 NOTE — Plan of Care
Inpatient Occupational Therapy Progress Note 06/12/22 1333 Date of Visit / Treatment Date of Visit / Treatment 06/12/22 Note Type Progress Note Treatment Deferred Deferred Treatment Comments 22 General Information Subjective Pt agreeable to OT Other Providers PT General Observations upright in bedside chair; PIV intact; R hand brace donned Precautions/Limitations Fall Precautions Pain/Comfort Pain Comment (Pre/Post Treatment Pain) denies pain Patient Coping Observed Emotional State calm;cooperative Cognition Overall Cognitive Status WFL Orientation Level Oriented X4 Level of Consciousness alert Following Commands Follows one step commands with increased time;Follows multistep commands with repetition Personal Safety / Judgment Requires supervision with mobility;Requires cuing/assistance to correct errors made;Decreased awareness of need for assistance;Decreased awareness of need for safety;Decreased recognition / insight of own deficits Cognition Comments occasionally abandons walker and is inattentive to task, requires cues, increased time to process and repetition to follow commands. educated on use of RW for safety due to unsteadiness Skin Assessment Skin Assessment See Nursing Documentation Posture, Head/Trunk Alignment Posture, Head/Trunk Alignment Forward head;Rounded shoulders Balance Sitting Balance: Static  GOOD-    Maintains static position against minimal resistance with no Assistive Device Sitting Balance: Dynamic  FAIR+    Performs dynamic activities through full range with Supervision Standing Balance: Static FAIR       Maintains static position without assist or device, may require Supervision or Verbal Cues (<2 minutes) Standing Balance: Dynamic  FAIR      Performs dynamic activities through 75% range Contact Guard or partial range (50-75%) with Supervision Balance Assist Device Rolling walker;Gait belt BADL Retraining BADL Retraining Detailed Documentation Toilet Training Toilet Training Independence/Assistance Level Contact guard;Assist of 1;Verbal cues Assistive Device Commode;Rolling walker Hygiene Performance contact guard Clothing Management contact guard Toilet Training Comments Pt performs toilet tx with CGAx1 using RW with cues for safety  AM-PAC - Daily Activity IP Short Form Help needed from another person putting on/taking off regular lower body clothing 3 - A Little Help needed from another person for bathing (incl. washing, rinsing, drying) 3 - A Little Help needed from another person for toileting (incl. using toilet, bedpan, urinal) 3 - A Little Help needed from another person putting on/taking off regular upper body clothing 3 - A Little Help needed from another person taking care of personal grooming such as brushing teeth 3 - A Little Help needed from another person eating meals 4 - None AM-PAC Daily Activity Raw Score (Total of rows above) 19 Comments 19 Sit-Stand Transfer Training Symptoms Noted During/After Treatment Marketing executive) none Sit-to-Stand Transfer Independence/Assistance Level Contact guard Sit-to-Stand Transfer Assist Device Rolling walker;Gait belt Stand-to-Sit Transfer Independence/Assistance Level Contact guard;Assist of 1;Verbal cues Stand-to-Sit Transfer Assist Device Rolling walker;Gait belt Transfer Safety Analysis Concerns decreased balance during turns;cues for hand placement Transfer Safety Analysis Impairments impaired balance;decreased strength Stand-Sit Transfer Comments requires cues; pt asks am I there? before sitting, educated to feel for the chair on the back of both legs before reaching back to sit for safety Gait Training Symptoms Noted During/After Treatment  fatigue Independence/Assistance Level  Supervision;Contact guard Assistive Device  Rolling walker;Gait belt Gait Distance (household distances) Ambulation distance was limited by: fatigue Stair Performance Stair Performance Comment Pt performed stairs with PT Handoff Documentation Handoff Patient in chair;Chair alarm;Patient instructed to call nursing for mobility;Discussed with nursing Endurance Endurance Comments fair+ Clinical Impression Follow up Assessment Pt seen for follow up, performs functional mobility at household distances and ADL routine with overall S-CGA using RW, cues for safe use of RW throughout. Demos some improvement due to decreased pain today,  however remains limited by impaired balance, decreased functional activity tolerance, and decreased strength. Since pt is refusing STR, recommend d/c home with 24/7 S and home PT/OT/RN to further improve functional mobility, safety and (I) in BADLs/MRADLs. Frequency/Equipment Recommendations OT Frequency 3x per week OT Recommendations for Inpatient Admission Activity/Level of Assist assist of 1;supervision;contact guard;with rolling walker ADL Recommendations assist of 1;supervision;contact guard;with rolling walker Planned Treatment / Interventions Plan for Next Visit ADLs, functional activity tolerance training OT Discharge Summary Disposition Recommendations (free text) Pt reports someone will be staying with her 24/7 for 2 weeks and is refusing STR, recommend home with 24/7 S and home PT/OT. Problem: Occupational Therapy GoalsGoal: Rehab Plan of Care Review, OTOutcome: Interventions implemented as appropriateGoal: Rehab Individuality and Mutuality, OTOutcome: Interventions implemented as appropriateGoal: Rehab Discharge Needs Assessment, OTOutcome: Interventions implemented as appropriateGoal: Occupational Therapy GoalsDescription: Pt will safely perform toilet tx with S using RWPt will safely perform LB dressing with S using least restrictive adaptive equipment as needed to reduce pain and increase safety during ADL routinePt will increase dynamic standing balance to fair- in order to reduce risk of falls during ADL routineOutcome: Interventions implemented as appropriate Plan of Care Overview/ Patient Status

## 2022-06-12 NOTE — Plan of Care
Problem: Skin Injury Risk IncreasedGoal: Skin Health and IntegrityOutcome: Interventions implemented as appropriateIntervention: Optimize Skin ProtectionFlowsheets (Taken 06/12/2022 1110)Pressure Reduction Techniques: frequent weight shift encouragedPressure Reduction Devices: pressure-redistributing mattress utilizedActivity Management:? activity minimized? ambulated in room? up in chairHead of Bed [facility]) Positioning: HOB at 30-45 degreesIntervention: Promote and Optimize Oral IntakeFlowsheets (Taken 06/12/2022 1110)Oral Nutrition Promotion:? rest periods promoted? safe use of adaptive equipment encouraged Problem: Fall Injury RiskGoal: Absence of Fall and Fall-Related InjuryOutcome: Interventions implemented as appropriateIntervention: Identify and Manage ContributorsFlowsheets (Taken 06/12/2022 0930)Self-Care Promotion: independence encouragedMedication Review/Management:? high-risk medications identified? medications reviewedIntervention: Promote Injury-Free EnvironmentFlowsheets (Taken 06/12/2022 0930)Universal Fall Prevention:? safety round/check completed? room organization consistent? nonskid shoes/slippers when out of bed? mobility aid in reach? keep room clutter free with open pathway to the bathroom? assistive device/personal items within reach? fall prevention program maintained Problem: Wound Healing ProgressionGoal: Optimal Wound HealingOutcome: Interventions implemented as appropriateIntervention: Promote Wound HealingFlowsheets (Taken 06/12/2022 1110)Oral Nutrition Promotion:? rest periods promoted? safe use of adaptive equipment encouragedPressure Reduction Techniques: frequent weight shift encouragedPressure Reduction Devices: pressure-redistributing mattress utilizedSleep/Rest Enhancement:? awakenings minimized? consistent schedule promoted? natural light exposure provided? regular sleep/rest pattern promoted? relaxation techniques promotedActivity Management:? activity minimized? ambulated in room? up in chairPain Management Interventions:? around-the-clock dosing utilized? care clustered? pain management plan reviewed with patient/caregiver? relaxation techniques promoted Plan of Care Overview/ Patient Status    Assumed care of patient at 0700. Alert and oriented x4. Denies chest pain, dizziness, shortness of breath, nausea, or vomiting. Lung fields are clear/diminished on room air. Dressing to right elbow C/D/I. Reports 1/10 posterior neck pain with activity. Scheduled tylenol administered with good effect. Safety maintained. Call bell within reach.

## 2022-06-12 NOTE — Discharge Summary
Med/Surg Discharge SummaryPatient Data:  Patient Name: Sarah Montes Age: 88 y.o. DOB: 1931/05/23	 MRN: [id number]	 Admit date: 8/25/2023Discharge date: 06/12/2022 Discharge Attending Physician: Melton Alar, MDPCP: Mardene Celeste, NPPrincipal Diagnosis: Acute bilateral low back pain without sciaticaOther Diagnosis: Neck pain FallDischarged Condition: goodDisposition: Home with Homecare ServicesAllergies Allergies Allergen Reactions ? Adhesive Rash   TapeTapeTapeTape ? Alendronate GI Upset, Nausea And Vomiting and Other (See Comments) ? Alendronic Acid Nausea And Vomiting ? Atorvastatin Other (See Comments) and Unknown   Elevated ckElevated ckElevated ckOther reaction(s): Other (See Comments)Elevated ckElevated ckElevated ck ? Penicillins Other (See Comments) ? Phenylalanine Other (See Comments)   Other reaction(s): Other (See Comments) ? Oxycodone Rash  Hospital Course: [name] is a 88 y.o. year old woman with a PMH significant for glaucoma and urinary incontinence, who presented with c/o low back pain.  Pain started about 3 weeks prior to presentation.  And at that time she saw her primary care doctor who prescribed prednisone and tramadol.  She took about 6 days of prednisone and a few doses of tramadol.  Pain subsequently completely resolved, hence she discontinued both.  She was doing well up until 5 days prior to presentation, when the pain recurred.  Pain is located in the left lower back.  She denied any radiation.  It has worsened over the past 2 days, so much so that this morning she fell into her closet hitting her right shoulder and right hip.  She did also hit her head.  She denied any focal numbness or weakness.  She does have a longstanding history of urinary incontinence usually at nighttime, which is at her baseline.  Her last bowel movement was 2 days ago, and she has not had 1 since.  She denied any fevers or chills.?On presentation to the ED, she was afebrile and was noted to be hypertensive.  She was saturating well on room air.  Blood work was consistent with a white count of 11.8.  Hemoglobin was 10.7, with a baseline of around 12 from October 2022.  Sodium was 134.  Alkaline phosphatase was elevated at 276, which was previously normal in 2022.  AST was 49 with normal ALT.  Alcohol level was negative.  Urine tox was negative.  Urinalysis was consistent with 4 RBCs and 9 WBCs.  [place] head and C-spine did not show any acute findings. MRI of the lumbar spine consistent with multilevel degenerative disc disease and she was noted to have impingement of exiting left L3 nerve friend bilateral L5 nerves.  Cervical spine MRI was consistent with degenerative changes.  MRI findings were discussed with Neurosurgery at [facility] who recommended supportive management and pain control.  Pain was controlled with Tylenol, Toradol and Flexeril.  Lidocaine patch also helped.  In addition, she was started on prednisone 60 milligrams daily and will continue on this with a taper.  She will follow-up with Neurosurgery after discharge for further discussion and management.  In addition, she was also started on gabapentin 300 milligrams at nighttime, and this can be up titrated to effect as needed.Pertinent lab findings and test results: MRI Cervical Spine wo IV ContrastResult Date: 06/11/2022 Degenerative change as above most significant at C3-4 and C4-5. Reported and signed by:  Anne Shutter, MD MRI Lumbar Spine wo IV ContrastResult Date: 8/25/2023Multilevel degenerative disc changes to include multilevel spinal canal and neural foraminal stenosis. Question impingement of exiting left L3 nerve. Suspect impingement of both L5 nerves at the L4-5 level. Correlate  clinically. Reported and signed by:  Herma Ard, MD ?[place] Cervical Spine wo IV ContrastResult Date: 06/10/2022  Degenerative change. No evidence of fracture or dislocation. Coronal and sagittal reformatted images confirm the above findings. Reported and signed by:  Anne Shutter, MD ?[facility] Head wo IV ContrastResult Date: 8/25/2023Diffuse age-related cerebral atrophy. Periventricular white matter change, likely due to chronic small vessel ischemic disease. Reported and signed by:  Anne Shutter, MDDischarge vitals: Blood pressure 109/60, pulse 65, temperature 97.7 ?F (36.5 ?C), temperature source Oral, resp. rate 18, height 5' 3 (1.6 m), weight 59.2 kg, SpO2 96 %.Discharge Physical Exam:Physical ExamConstitutional:     Appearance: Normal appearance. HENT:    Head: Normocephalic and atraumatic.    Mouth/Throat:    Mouth: Mucous membranes are moist.    Pharynx: Oropharynx is clear. Eyes:    General: No scleral icterus.Cardiovascular:    Rate and Rhythm: Normal rate and regular rhythm.    Heart sounds: No murmur heard.   No friction rub. No gallop. Pulmonary:    Effort: Pulmonary effort is normal. No respiratory distress.    Breath sounds: Normal breath sounds. No stridor. No wheezing, rhonchi or rales. Abdominal:    General: Bowel sounds are normal. There is no distension.    Palpations: Abdomen is soft. There is no mass.    Tenderness: There is no abdominal tenderness. There is no guarding or rebound. Musculoskeletal:    Cervical back: Normal range of motion and neck supple.    Comments: Range of motion restricted at neck.  TTP in the left lumbar spinal area Skin:   General: Skin is warm and dry. Neurological:    General: No focal deficit present.    Mental Status: She is alert and oriented to person, place, and time.  Pending Labs and Tests:  noneISSUES TO BE ADDRESSED POST DISCHARGE: 1. Neck and low back pain Discharge Medications: Current Discharge Medication List  START taking these medications  Details acetaminophen (TYLENOL) 325 mg tablet Take 2 tablets (650 mg total) by mouth every 6 (six) hours as needed for up to 10 days.Qty: 40 tablet, Refills: 0Start date: 06/12/2022, End date: 06/22/2022  cyclobenzaprine (FLEXERIL) 5 mg tablet Take 1 tablet (5 mg total) by mouth every 8 (eight) hours as needed for muscle spasms for up to 10 days.Qty: 30 tablet, Refills: 0Start date: 06/12/2022, End date: 06/22/2022  gabapentin (NEURONTIN) 300 mg capsule Take 1 capsule (300 mg total) by mouth nightly.Qty: 30 capsule, Refills: 1Start date: 06/12/2022  lidocaine 4 % topical patch Place 1 patch onto the skin every 24 hours. Remove & Discard patch within 12 hours or as directed by MDQty: 20 patch, Refills: 0Start date: 06/12/2022  predniSONE (DELTASONE) 10 mg tablet Take 6 tablets (60 mg total) by mouth daily for 3 days, THEN 5 tablets (50 mg total) daily for 2 days, THEN 4 tablets (40 mg total) daily for 2 days, THEN 3 tablets (30 mg total) daily for 2 days, THEN 2 tablets (20 mg total) daily for 2 days, THEN 1 tablet (10 mg total) daily for 2 days. Take with food.Rob Bunting: 48 tablet, Refills: 0Start date: 06/13/2022, End date: 06/26/2022   CONTINUE these medications which have NOT CHANGED  Details brimonidine (ALPHAGAN) 0.2 % ophthalmic solution Place 1 drop into both eyes 2 (two) times daily.  calcium carbonate (CALTRATE 600) 1500 mg (600 mg calcium) tablet Take 1 tablet (600 mg total) by mouth daily.  cholecalciferol, vitamin D3, 25 mcg (1,000 unit) tablet Take 1 tablet (1,000 Units  total) by mouth daily.  dorzolamide-timoloL (COSOPT) 22.3-6.8 mg/mL ophthalmic solution Place 1 drop into the left eye 2 (two) times daily.  ketoconazole (NIZORAL) 2 % cream daily as needed.  latanoprostene bunod (VYZULTA) 0.024 % ophthalmic solution Place 1 drop into both eyes nightly.  vit C-E-cupric-zinc-lutein 226-90-0.8-5 mg Cap Take 1 capsule by mouth 2 (two) times daily.   STOP taking these medications   traMADoL (ULTRAM) 50 mg tablet    .Follow-up Information:Furtado, Howell Rucks, NP[address] [facility] [address] [address] [place] [phone] up in 2 [facility] [place] [facility] [address] [phone], Jacky Vassie Moselle, MD[address] [facility] [place] [phone] PMH PSH Past Medical History: Diagnosis Date ? Abnormal cervical Papanicolaou smear  ? Glaucoma  ? Macular degeneration disease   Past Surgical History: Procedure Laterality Date ? BLADDER SUSPENSION   ? CATARACT EXTRACTION   ? HYSTERECTOMY    Social History Family History Social History Tobacco Use ? Smoking status: Never ? Smokeless tobacco: Not on file Substance Use Topics ? Alcohol use: Yes   Comment: rare  Family History Problem Relation Age of Onset ? Breast cancer Mother  ? Breast cancer Maternal Aunt  ? Ovarian cancer Maternal Grandmother   Encounter Time:  > 30 minutesElectronically Signed:[facility] Welton Flakes, MD8/27/2023 1:47 PMBest Contact Information: [phone] report was created in part from templates and voice recognition software. Typographical and minor dictation errors may be present.

## 2022-06-12 NOTE — Plan of Care
Problem: Adult Inpatient Plan of CareGoal: Readiness for Transition of CareOutcome: Outcome(s) achieved Problem: Skin Injury Risk IncreasedGoal: Skin Health and IntegrityOutcome: Outcome(s) achieved Problem: Fall Injury RiskGoal: Absence of Fall and Fall-Related InjuryOutcome: Outcome(s) achieved Problem: Wound Healing ProgressionGoal: Optimal Wound HealingOutcome: Outcome(s) achieved Problem: Physical Therapy GoalsGoal: Rehab Plan of Care Review, PTOutcome: Outcome(s) achievedGoal: Rehab Individuality and Mutuality, PTOutcome: Outcome(s) achievedGoal: Rehab Discharge Needs Assessment, PTOutcome: Outcome(s) achievedGoal: Physical Therapy GoalsDescription: Pt will perform bed mobility independently in 5 visits for decreased risk of skin breakdown.Pt will perform sit<>stand independently in 5 visits for ease of performing toilet transfersPt will ambulate 150 feet with SPC independently in 5 visits for safe home distance management.Improve AMPAC to 22 for improved functional mobility.Pt will navigate 12 stairs in 5 visits with railing and SPC to safely navigate home.Outcome: Outcome(s) achieved Plan of Care Overview/ Patient Status    Assumed care of pt @ 1500 medically cleared for d/c home via private car son to transport.physically left the facility @ [phone]

## 2022-06-12 NOTE — Other
Sarah Montes is a 88 y.o. female admitted with a chief complaint of bilateral lower back pain with sciatica.Patient is alert and oriented x4, independent/standby assist with a walker (cane at baseline). Pain controlled with tylenol, toradol, and flexeril this admission. Reports no pain in her lower back today with 1/10 pain to posterior neck with movement.  Vitals:  06/11/22 1333 06/11/22 2045 06/12/22 0555 06/12/22 1405 BP: 121/68 135/75 109/60 111/63 Pulse: 74 60 65 66 Resp: 16 18 18 18  Temp: 97.6 ?F (36.4 ?C) 97.5 ?F (36.4 ?C) 97.7 ?F (36.5 ?C) 97.7 ?F (36.5 ?C) TempSrc:  Temporal Oral Oral SpO2: 95% 96% 96% 97% Weight:   59.2 kg  Height:     Oxygen therapy Oxygen TherapySpO2: 97 %Device (Oxygen Therapy): room airCurrent Facility-Administered Medications Medication Dose Route Frequency Provider Last Rate Last Admin ? acetaminophen (TYLENOL) tablet 650 mg  650 mg Oral Q6H Melton Alar, MD   650 mg at 06/12/22 1305 ? brimonidine (ALPHAGAN) 0.2 % ophthalmic solution 1 drop  1 drop Both Eyes BID Melton Alar, MD   1 drop at 06/12/22 0454 ? cholecalciferol (vitamin D3) tablet 1,000 Units  1,000 Units Oral Daily Melton Alar, MD   1,000 Units at 06/12/22 0981 ? dorzolamide-timolol (PF) (COSOPT PF) 2-0.5 % ophthalmic solution in dropperette 1 drop  1 drop Left Eye BID Melton Alar, MD   1 drop at 06/12/22 1914 ? enoxaparin (LOVENOX) syringe 40 mg  40 mg Subcutaneous Daily Melton Alar, MD   40 mg at 06/12/22 7829 ? gabapentin (NEURONTIN) capsule 300 mg  300 mg Oral Nightly Melton Alar, MD   300 mg at 06/11/22 2018 ? latanoprost (XALATAN) 0.005 % ophthalmic solution 1 drop  1 drop Both Eyes Nightly Melton Alar, MD   1 drop at 06/11/22 2111 ? lidocaine 4 % topical patch 1 patch  1 patch Transdermal Q24H Melton Alar, MD   1 patch at 06/11/22 1607 ? melatonin tablet 6 mg  6 mg Oral Nightly Melton Alar, MD   6 mg at 06/11/22 2018 ? predniSONE (DELTASONE) tablet 60 mg  60 mg Oral Daily Melton Alar, MD   60 mg at 06/12/22 5621 ? sodium chloride 0.9 % flush 3 mL  3 mL IV Push Q8H Melton Alar, MD   3 mL at 06/10/22 2137 ? vit A,C and E-lutein-minerals (OCUVITE) 300 mcg-200 mg-27 mg-2 mg 1 tablet  1 tablet Oral Daily Melton Alar, MD   1 tablet at 06/12/22 1041  cyclobenzaprine, ketorolac, sodium chlorideCurrent Facility-Administered Medications Medication Dose Route Frequency Last Rate ? acetaminophen  650 mg Oral Q6H   ? brimonidine  1 drop Both Eyes BID   ? cholecalciferol (vitamin D3)  1,000 Units Oral Daily   ? cyclobenzaprine  5 mg Oral Q8H PRN   ? dorzolamide-timolol (PF)  1 drop Left Eye BID   ?  enoxaparin prophylaxis dosing  40 mg Subcutaneous Daily   ? gabapentin  300 mg Oral Nightly   ? ketorolac  15 mg IV Push Q6H PRN   ? latanoprost  1 drop Both Eyes Nightly   ? Lidocaine Patch  1 patch Transdermal Q24H   ? melatonin  6 mg Oral Nightly   ? predniSONE  60 mg Oral Daily   ? sodium chloride  3 mL IV Push Q8H   ? sodium chloride  3 mL IV Push PRN for Line Care   ? vit A,C and E-lutein-minerals  1 tablet Oral Daily   .I have reviewed patient valuables Belongings charted in last 7 days: Valuable(s) : Vision; Holiday representative; Purse; Keys; Wallet (06/10/2022  5:00 PM)

## 2022-06-12 NOTE — Plan of Care
Problem: Adult Inpatient Plan of CareGoal: Readiness for Transition of CareOutcome: Interventions implemented as appropriate Problem: Fall Injury RiskGoal: Absence of Fall and Fall-Related InjuryOutcome: Interventions implemented as appropriate Problem: Wound Healing ProgressionGoal: Optimal Wound HealingOutcome: Interventions implemented as appropriate Plan of Care Overview/ Patient Status    Assumed care of pt @ 1500: a/o x4 SBA to BR w walker voids freely without issue. Lungs are dim on RA. Rates pack/neck pain while ambulating as 5--10/10. Decling offer for toradol x2 at shift change and also before bed. Started on scheduled tylenol and lidocane patch w good effect states her back feels  pretty good right now. @ 2000. Ambulating in the halls after dinner. Requesting to take her three different eye drops in a special order and spaced apart: 1) brimonidine. 2)timolol five minutes later  then 3)xalatan. Call bell in each safety maintained.

## 2022-06-12 NOTE — Plan of Care
Plan of Care Overview/ Patient Status    Assumed care between 2300-0700. Patient A&Ox4, no acute events this shift. Patient given scheduled tylenol as per [facility], patient denying pain with 0600 tylenol. Patient is able to make needs known but can sometimes be forgetful with call bell, patient still alarmed for safety, will continue to reassess patient's needs through rest of this shift.

## 2022-06-13 ENCOUNTER — Encounter: Admit: 2022-06-13 | Payer: PRIVATE HEALTH INSURANCE | Primary: Family

## 2022-06-13 ENCOUNTER — Telehealth
Admit: 2022-06-13 | Payer: PRIVATE HEALTH INSURANCE | Attending: Student in an Organized Health Care Education/Training Program | Primary: Family

## 2022-06-13 NOTE — Progress Notes
[facility] Care Navigation:  Transition of Care Note Care Navigation spoke with: PatientDischarging Hospital: [facility] Admission Date: 8/25/2023Hospital Discharge Date: 06/12/2022    Diagnosis: Principal Diagnosis: Acute bilateral low back pain without sciaticaDischarge location: [facility] SN,PT and OTHOSPITALIZATION:Reason patient admitted: Presented with c/o low back pain, It has worsened over the past 2 days, so much so that this morning she fell into her closet hitting her right shoulder and right hip.  She did also hit her head.Diagnosis at discharge: Principal Diagnosis: Acute bilateral low back pain without sciaticaCURRENT STATE:Since discharge patient reports feeling: BetterNew symptoms reported include: Pt states she is feeling better, pain well controlled and pt feels steady on feetPatient cared for by: FamilyQuestions regarding surgical site: n/a REVIEW OF AFTER VISIT SUMMARY DOCUMENT: MEDICATION CHANGES:Validated NEW medications to take: YesValidated Changed medications to take: YesValidated Stopped medications to NOT take: YesIssues obtaining prescriptions: NoAdditional medication related notes: Reviewed new, changed and stopped meds per AVSPt keeping track of prednisone taper on calenderStates gabapentin making her feel funny and VNA nurse placed call to PCP to change dose from 300mg  to 100mg Pt states she is not taking Flexeril at this timeFOLLOW-UP APPOINTMENTS and TRANSPORTATION:Patient aware of scheduled appointments: YesAwareness and assistance with appointments needing to be scheduled: NoTransportation concerns for follow-up appointment: NoDME and HOME HEALTH SERVICES:Durable medical equipment received: N/AContact has been made with home care agency: Durene Fruits started today, will receive SN, PT and OTPlan established for follow up labs/tests: [facility] 8/30 ADDITIONAL PATIENT NEEDS: Additional patient needs addressed: Pt concerned that neurology appt is not until 10/9, advised pt that seems right for specialist at this time, advised she can ask PCP for a referral to another neurologist and to call back to check on wait list status Additional issues/barriers identified: ISSUES TO BE ADDRESSED POST DISCHARGE: 1.	Neck and low back pain

## 2022-06-13 NOTE — Telephone Encounter
Patient seen in ED, per d/c should schedule w/ Dayle Points. Yeung's next available is 10/10. Patient states she does not think she should wait that long. Please advise

## 2022-06-13 NOTE — Telephone Encounter
She can follow up with one of the PA's.

## 2022-06-16 ENCOUNTER — Ambulatory Visit: Admit: 2022-06-16 | Payer: MEDICARE | Primary: Family

## 2022-06-16 ENCOUNTER — Inpatient Hospital Stay: Admit: 2022-06-16 | Discharge: 2022-06-16 | Payer: MEDICARE | Primary: Family

## 2022-06-16 DIAGNOSIS — M4316 Spondylolisthesis, lumbar region: Secondary | ICD-10-CM

## 2022-06-16 DIAGNOSIS — R102 Pelvic and perineal pain: Secondary | ICD-10-CM

## 2022-07-25 ENCOUNTER — Encounter
Admit: 2022-07-25 | Payer: MEDICARE | Attending: Student in an Organized Health Care Education/Training Program | Primary: Family

## 2022-08-11 ENCOUNTER — Encounter
Admit: 2022-08-11 | Payer: MEDICARE | Attending: Student in an Organized Health Care Education/Training Program | Primary: Family

## 2022-09-22 ENCOUNTER — Encounter
Admit: 2022-09-22 | Payer: MEDICARE | Attending: Student in an Organized Health Care Education/Training Program | Primary: Family

## 2022-09-28 ENCOUNTER — Inpatient Hospital Stay: Admit: 2022-09-28 | Discharge: 2022-09-28 | Payer: MEDICARE | Primary: Family

## 2022-09-28 ENCOUNTER — Encounter: Admit: 2022-09-28 | Payer: PRIVATE HEALTH INSURANCE | Attending: Family | Primary: Family

## 2022-09-28 DIAGNOSIS — E782 Mixed hyperlipidemia: Secondary | ICD-10-CM

## 2022-09-28 DIAGNOSIS — D61818 Other pancytopenia: Secondary | ICD-10-CM

## 2022-09-28 DIAGNOSIS — E559 Vitamin D deficiency, unspecified: Secondary | ICD-10-CM

## 2022-09-28 LAB — COMPREHENSIVE METABOLIC PANEL
BKR ALANINE AMINOTRANSFERASE (ALT): 19 U/L (ref 12–78)
BKR ALBUMIN: 2.8 g/dL — ABNORMAL LOW (ref 3.4–5.0)
BKR ALKALINE PHOSPHATASE: 136 U/L — ABNORMAL HIGH (ref 45–117)
BKR ANION GAP (LM): 6 mmol/L — ABNORMAL LOW (ref 5–15)
BKR ASPARTATE AMINOTRANSFERASE (AST): 19 U/L (ref 15–37)
BKR BILIRUBIN TOTAL: 0.2 mg/dL (ref 0.2–1.0)
BKR CALCIUM: 9.6 mg/dL (ref 8.5–10.1)
BKR CHLORIDE: 106 mmol/L (ref 98–107)
BKR CO2: 28 mmol/L (ref 21–32)
BKR CREATININE: 0.94 mg/dL (ref 0.55–1.02)
BKR EGFR, CREATININE (CKD-EPI 2021): 58 mL/min/{1.73_m2} — ABNORMAL LOW (ref >=60)
BKR GLOBULIN: 4.7 g/dL (ref 2.5–5.0)
BKR GLUCOSE: 96 mg/dL (ref 65–110)
BKR POTASSIUM: 4 mmol/L (ref 3.5–5.1)
BKR PROTEIN TOTAL: 7.5 g/dL (ref 6.4–8.2)
BKR SODIUM: 140 mmol/L (ref 136–145)

## 2022-09-28 LAB — CBC WITH AUTO DIFFERENTIAL
BKR BLOOD UREA NITROGEN: 338 x1000/??L — ABNORMAL HIGH (ref 150–420)
BKR WAM ABSOLUTE IMMATURE GRANULOCYTES.: 0.02 x 1000/??L (ref 0.00–0.30)
BKR WAM ABSOLUTE LYMPHOCYTE COUNT.: 2.89 x 1000/??L — ABNORMAL LOW (ref 0.60–3.70)
BKR WAM ABSOLUTE NEUTROPHIL COUNT.: 3.59 x 1000/??L (ref 2.00–7.60)
BKR WAM BASOPHIL ABSOLUTE COUNT.: 0.04 x 1000/??L (ref 0.00–1.00)
BKR WAM BASOPHILS: 0.5 % (ref 0.2–1.0)
BKR WAM EOSINOPHIL ABSOLUTE COUNT.: 0.15 x 1000/??L (ref 0.00–1.00)
BKR WAM EOSINOPHILS: 2 % (ref 0.0–5.0)
BKR WAM HEMATOCRIT (2 DEC): 31.5 % — ABNORMAL LOW (ref 35.00–45.00)
BKR WAM HEMOGLOBIN: 9.6 g/dL — ABNORMAL LOW (ref 11.7–15.5)
BKR WAM IMMATURE GRANULOCYTES: 0.3 % — ABNORMAL HIGH (ref 45–117)
BKR WAM LYMPHOCYTES: 39.2 % (ref 17.0–50.0)
BKR WAM MCH PG: 29.6 pg (ref 27.0–33.0)
BKR WAM MCHC: 30.5 g/dL — ABNORMAL LOW (ref 31.0–36.0)
BKR WAM MCV: 97.2 fL (ref 80.0–100.0)
BKR WAM MONOCYTE ABSOLUTE COUNT.: 0.68 x 1000/??L (ref 0.00–1.00)
BKR WAM MONOCYTES: 9.2 % — ABNORMAL LOW (ref 4.0–12.0)
BKR WAM MPV: 9.9 fL (ref 8.0–12.0)
BKR WAM NEUTROPHILS: 48.8 % (ref 39.0–72.0)
BKR WAM NUCLEATED RED BLOOD CELLS: 0 % (ref 0.0–1.0)
BKR WAM PLATELETS: 338 x1000/ÂµL (ref 150–420)
BKR WAM RDW-CV: 14.7 % (ref 11.0–15.0)
BKR WAM RED BLOOD CELL COUNT.: 3.24 M/??L — ABNORMAL LOW (ref 4.00–6.00)
BKR WAM WHITE BLOOD CELL COUNT: 7.4 x1000/ÂµL (ref 4.0–11.0)

## 2022-09-28 LAB — LIPID PANEL
BKR CHOLESTEROL: 194 mg/dL
BKR HDL CHOLESTEROL: 77 mg/dL — ABNORMAL HIGH
BKR LDL CHOLESTEROL SAMPSON CALCULATED: 100 mg/dL
BKR TRIGLYCERIDES: 97 mg/dL

## 2022-09-28 LAB — IRON AND TIBC
BKR IRON SATURATION (SCCCT BH GH LM): 16 %
BKR IRON: 42 ug/dL — ABNORMAL LOW (ref 50–170)
BKR TOTAL IRON BINDING CAPACITY (SCCCT  BH GH LM): 268 ug/dL (ref 250–450)

## 2022-10-01 LAB — VITAMIN D, 1,25 DIHYDROXY LC/MS/MS
VITAMIN D, 1,25 (OH)2, TOTAL: 29 pg/mL (ref 18–72)
VITAMIN D2, 1,25 (OH)2: <8 pg/mL (ref 4.0–11.0)
VITAMIN D3, 1,25 (OH)2: 29 pg/mL

## 2022-10-12 ENCOUNTER — Encounter: Admit: 2022-10-12 | Payer: MEDICARE | Attending: Obstetrics and Gynecology | Primary: Family

## 2022-10-12 ENCOUNTER — Telehealth: Admit: 2022-10-12 | Payer: PRIVATE HEALTH INSURANCE | Attending: Obstetrics and Gynecology | Primary: Family

## 2022-10-12 MED ORDER — VALACYCLOVIR 500 MG TABLET
500 | ORAL | 1.00 refills | 10.00000 days | Status: AC
Start: 2022-10-12 — End: 2023-03-09

## 2022-10-13 ENCOUNTER — Ambulatory Visit: Admit: 2022-10-13 | Payer: MEDICARE | Attending: Obstetrics and Gynecology | Primary: Family

## 2022-10-13 ENCOUNTER — Encounter: Admit: 2022-10-13 | Payer: PRIVATE HEALTH INSURANCE | Attending: Obstetrics and Gynecology | Primary: Family

## 2022-10-26 ENCOUNTER — Encounter: Admit: 2022-10-26 | Payer: PRIVATE HEALTH INSURANCE | Attending: Obstetrics and Gynecology | Primary: Family

## 2022-10-26 DIAGNOSIS — Z1231 Encounter for screening mammogram for malignant neoplasm of breast: Secondary | ICD-10-CM

## 2022-11-02 ENCOUNTER — Inpatient Hospital Stay: Admit: 2022-11-02 | Discharge: 2022-11-02 | Payer: MEDICARE | Primary: Family

## 2022-11-02 ENCOUNTER — Encounter: Admit: 2022-11-02 | Payer: PRIVATE HEALTH INSURANCE | Primary: Family

## 2022-11-02 DIAGNOSIS — H409 Unspecified glaucoma: Secondary | ICD-10-CM

## 2022-11-02 DIAGNOSIS — H3530 Unspecified macular degeneration: Secondary | ICD-10-CM

## 2022-11-02 DIAGNOSIS — R87619 Unspecified abnormal cytological findings in specimens from cervix uteri: Secondary | ICD-10-CM

## 2022-11-02 DIAGNOSIS — Z1231 Encounter for screening mammogram for malignant neoplasm of breast: Secondary | ICD-10-CM

## 2023-01-04 ENCOUNTER — Inpatient Hospital Stay: Admit: 2023-01-04 | Discharge: 2023-01-04 | Payer: MEDICARE

## 2023-01-04 ENCOUNTER — Emergency Department: Admit: 2023-01-04 | Payer: MEDICARE | Primary: Family

## 2023-01-04 DIAGNOSIS — R040 Epistaxis: Secondary | ICD-10-CM

## 2023-01-04 DIAGNOSIS — S8002XA Contusion of left knee, initial encounter: Secondary | ICD-10-CM

## 2023-01-04 DIAGNOSIS — S60512A Abrasion of left hand, initial encounter: Secondary | ICD-10-CM

## 2023-01-04 DIAGNOSIS — S81812A Laceration without foreign body, left lower leg, initial encounter: Secondary | ICD-10-CM

## 2023-01-04 DIAGNOSIS — S50312A Abrasion of left elbow, initial encounter: Secondary | ICD-10-CM

## 2023-01-04 DIAGNOSIS — S81811A Laceration without foreign body, right lower leg, initial encounter: Secondary | ICD-10-CM

## 2023-01-04 MED ORDER — ACETAMINOPHEN 325 MG TABLET
325 mg | Freq: Once | ORAL | Status: CP
Start: 2023-01-04 — End: ?
  Administered 2023-01-04: 16:00:00 325 mg via ORAL

## 2023-01-05 NOTE — ED Provider Notes
Chief Complaint Patient presents with  Fall   Pt arrives via ems from [facility]- pt fell from treadmill. Skin tear R shin, L hand and elbow. Swelling noted in nose.  MDM 89 year old female arrives via EMS after sustaining a fall from a treadmill at the [facility].  She reports she pushed the wrong button and the treadmill spit-up, causing her to fall forward.  She sustained skin tears and lacerations to bilateral lower legs as well as her left elbow.  She also fell onto her face, hitting her nose and causing a nosebleed.  She is complaining of right lower extremity pain.  She denies dizziness, loss of consciousness, neck pain, back pain.  She takes no aspirin or blood thinners.  She did not ambulate at the scene.  She lives alone.  She ambulates without assist.  She is a currently practicing psychiatric nurse practitioner.  She does not take blood thinners. Physical ExamED Triage Vitals [01/04/23 1105]BP: (!) 174/95Pulse: 75Pulse from  O2 sat: n/aResp: 20Temp: 97.8 ?F (36.6 ?C)Temp src: n/aSpO2: 98 % BP 138/79  - Pulse 69  - Temp 97.8 ?F (36.6 ?C)  - Resp 20  - Ht 5' 3 (1.6 m)  - Wt 61.6 kg  - SpO2 98%  - BMI 24.06 kg/m? Physical ExamVitals and nursing note reviewed. Constitutional:     General: She is not in acute distress.   Appearance: She is normal weight. She is not ill-appearing, toxic-appearing or diaphoretic. HENT:    Head: Normocephalic.    Comments: Swelling to nose   Right Ear: Tympanic membrane normal.    Left Ear: Tympanic membrane normal.    Nose:    Comments: No active epistaxis Hyperemic mucosa at Kiesselbach's plexus   Mouth/Throat:    Mouth: Mucous membranes are moist.    Pharynx: Oropharynx is clear. Eyes:    Extraocular Movements: Extraocular movements intact.    Conjunctiva/sclera: Conjunctivae normal.    Pupils: Pupils are equal, round, and reactive to light. Cardiovascular:    Rate and Rhythm: Normal rate and regular rhythm. Pulmonary:    Effort: Pulmonary effort is normal.    Breath sounds: Normal breath sounds. Musculoskeletal:       General: Swelling, tenderness and signs of injury present. Normal range of motion.    Cervical back: Normal range of motion and neck supple. No tenderness.    Right lower leg: No edema.    Left lower leg: No edema. Skin:   General: Skin is warm and dry.    Comments: Large laceration and ecchymosis to right shinLarge superficial abrasions to bilateral kneesSmall abrasion noted to left elbowSmall skin tear noted to left hand dorsum Neurological:    General: No focal deficit present.    Mental Status: She is alert and oriented to person, place, and time. Psychiatric:       Mood and Affect: Mood normal.       Behavior: Behavior normal.       Thought Content: Thought content normal.       Judgment: Judgment normal.  Lac RepairDate/Time: 01/05/2023 1:33 PMPerformed by: Octaviano Batty, PAAuthorized by: Octaviano Batty, PA  Time out documented by nurse: noTime out unable to be performed due to emergent nature of procedure:  NoTiming: the time out is initiated prior to the beginning of the procedure:  YesName of patient and medical record or DOB stated and matches ID band or previously confirmed medical record number.:  YesProceduralist states or confirms the procedure to be performed:  [facility] procedural  consent is used to verify the procedure to be performed and it matches the patient identifiers:  N/ASite of procedure(s) (with laterality or level) is topically marked per policy and visible after draping:  N/A(IF SITE NOT MARKED) Radiographic imaging is present or a laterality side/site band is present on patient and is accessible:  N/AAnesthesia:   Anesthesia method:  Local infiltration  Local anesthetic:  Lidocaine 1% WITH epiLaceration details:   Location:  Leg  Leg location:  R lower leg  Length (cm): 20  Depth (mm):  5Repair type:   Repair type:  IntermediatePre-procedure details:   Preparation:  Patient was prepped and draped in usual sterile fashionExploration:   Hemostasis achieved with:  Epinephrine  Wound exploration: wound explored through full range of motion    Contaminated: no  Treatment:   Area cleansed with:  Saline  Amount of cleaning:  StandardSkin repair:   Repair method:  Sutures and tissue adhesive  Suture size:  4-0  Suture material:  Nylon  Suture technique:  Simple interrupted  Number of sutures:  30Approximation:   Approximation:  ClosePost-procedure details:   Dressing:  Antibiotic ointment and non-adherent dressing.  Patient tolerance of procedure:  Tolerated well, no immediate complications.Attestation/Critical Care3 cm flap laceration to left knee closed with tissue adhesive.X-rays of bilateral knees and bilateral tib-fib show no fracture or dislocation.Patient ambulated without difficulty.  She was referred to Wound Care for further evaluation and treatment.Clinical Impressions as of 01/05/23 1545 Multiple contusions Laceration of skin of right lower leg, initial encounter Laceration of left knee, initial encounter  ED DispositionDischargePatient seen independently.  My supervising physician was available for consultation. Octaviano Batty, PA03/21/24 1545

## 2023-01-10 ENCOUNTER — Inpatient Hospital Stay: Admit: 2023-01-10 | Discharge: 2023-01-10 | Payer: MEDICARE | Primary: Family

## 2023-01-10 DIAGNOSIS — R600 Localized edema: Secondary | ICD-10-CM

## 2023-01-10 DIAGNOSIS — R609 Edema, unspecified: Secondary | ICD-10-CM

## 2023-01-10 DIAGNOSIS — T07XXXA Unspecified multiple injuries, initial encounter: Secondary | ICD-10-CM

## 2023-01-10 DIAGNOSIS — S80212D Abrasion, left knee, subsequent encounter: Secondary | ICD-10-CM

## 2023-01-10 DIAGNOSIS — L97919 Non-pressure chronic ulcer of unspecified part of right lower leg with unspecified severity: Secondary | ICD-10-CM

## 2023-01-12 NOTE — Progress Notes
History & PhysicalHistory provided by: the patient, adult child(ren), and EMR reviewHistory limited by: no limitationsVisit Diagnosis:   SNOMED [place](R) 1. Ulcer of right lower leg, with unspecified severity (HC Code)  ULCER OF RIGHT LOWER LEG 2. Peripheral edema  PERIPHERAL EDEMA 3. Knee abrasion, left, subsequent encounter  ABRASION OF SKIN OF LEFT KNEE 4. Multiple open wounds  MULTIPLE OPEN WOUNDS Subjective: Chief Complaint: right leg and left hand /left elbow, and left knee wound History of Present Illness: This is a 89 y.o. female with PMH significant for glaucoma and macular degeneration.  She lives alone.  She initially presents today 01/10/2023 for evaluation of skin tear to right shin, left hand, left elbow, and left knee secondary to fall from a treadmill at the [facility].  She was seen in the ED on 01/04/23 with multiple sutures placed to right lower leg skin flap laceration.  Bilateral knee and bilateral tib-fib x-ray showed no fracture dislocation. Arrived today 01/10/23 she presented ambulatory with her son.  She reports no active pain.  She denies signs of infection including no chills. Medical History: PMH PSH Past Medical History: Diagnosis Date  Abnormal cervical Papanicolaou smear   Glaucoma   Macular degeneration disease   Past Surgical History: Procedure Laterality Date  BLADDER SUSPENSION    CATARACT EXTRACTION    HYSTERECTOMY    Social History Family History Social History Socioeconomic History  Marital status: Widowed   Spouse name: Not on file  Number of children: Not on file  Years of education: Not on file  Highest education level: Not on file Occupational History  Not on file Tobacco Use  Smoking status: Never  Smokeless tobacco: Not on file Vaping Use  Vaping Use: Never used Substance and Sexual Activity  Alcohol use: Yes   Comment: rare  Drug use: Not on file  Sexual activity: Not Currently Other Topics Concern  Not on file Social History Narrative  Not on file Social Determinants of Health Financial Resource Strain: Low Risk  (06/10/2022)  Overall Financial Resource Strain (CARDIA)   Difficulty of Paying Living Expenses: Not hard at all Food Insecurity: No Food Insecurity (06/10/2022)  Hunger Vital Sign   Worried About Running Out of Food in the Last Year: Never true   Ran Out of Food in the Last Year: Never true Transportation Needs: No Transportation Needs (06/10/2022)  PRAPARE - Designer, jewellery (Medical): No   Lack of Transportation (Non-Medical): No Physical Activity: Not on file Stress: Not on file Social Connections: Not on file Intimate Partner Violence: Not on file Housing Stability: Low Risk  (06/10/2022)  Housing Stability   Housing Stability: I have a steady place to live   Housing Stability: Not on file  Family History Problem Relation Age of Onset  Breast cancer Mother   Breast cancer Maternal Aunt   Ovarian cancer Maternal Grandmother   Home Medications .  Medication Sig Taking? brimonidine (ALPHAGAN) 0.2 % ophthalmic solution Place 1 drop into both eyes 2 (two) times daily. Yes calcium carbonate (CALTRATE 600) 1500 mg (600 mg calcium) tablet Take 1 tablet (600 mg total) by mouth daily. Yes cholecalciferol, vitamin D3, 25 mcg (1,000 unit) tablet Take 1 tablet (1,000 Units total) by mouth daily. Yes dorzolamide-timoloL (COSOPT) 22.3-6.8 mg/mL ophthalmic solution Place 1 drop into the left eye 2 (two) times daily. Yes latanoprostene bunod (VYZULTA) 0.024 % ophthalmic solution Place 1 drop into both eyes nightly. Yes vit C-E-cupric-zinc-lutein 226-90-0.8-5 mg Cap Take 1 capsule by mouth 2 (  two) times daily. Yes ketoconazole (NIZORAL) 2 % cream daily as needed.  valACYclovir (VALTREX) 500 mg tablet TAKE 1 TABLET BY MOUTH TWICE A DAY X 3 DAYSPatient not taking: Reported on 01/10/2023   Allergies Allergies Allergen Reactions  Adhesive Rash   TapeTapeTapeTape  Alendronate GI Upset, Nausea And Vomiting and Other (See Comments)  Alendronic Acid Nausea And Vomiting  Atorvastatin Other (See Comments) and Unknown   Elevated ckElevated ckElevated ckOther reaction(s): Other (See Comments)Elevated ckElevated ckElevated ck  Penicillins Other (See Comments)  Phenylalanine Other (See Comments)   Other reaction(s): Other (See Comments)  Tramadol Hallucinations  Oxycodone Rash  Objective: Vitals:Vitals:  01/10/23 1434 BP: (!) 148/76 Pulse: 72 Resp: 16 Temp: 97.2 ?F (36.2 ?C) Physical Exam: Well-appearing with no diaphoresis and no appearance of acute distress.  She is alert and oriented with pleasant affectRLE: 1+ edema.  Mild hemosiderin staining and varicosities.  DP and PT pulse is softly palpable and biphasic on Doppler.Wound ExamWound 01/10/23 1446 Traumatic Left anterior knee (Active) Dressing Appearance intact 01/10/23 1400 Drainage Amount small 01/10/23 1400 Drainage Characteristics/Odor serosanguineous 01/10/23 1400 Base red 01/10/23 1400 Periwound intact 01/10/23 1400 Edges irregular 01/10/23 1400 Wound Length (cm) 2 cm 01/10/23 1400 Wound Width (cm) 2 cm 01/10/23 1400 Wound Depth (cm) 0.1 cm 01/10/23 1400 Wound Surface Area (cm^2) 4 cm^2 01/10/23 1400 Wound Volume (cm^3) 0.4 cm^3 01/10/23 1400 Tunneling [Depth (cm)/Location] 0 01/10/23 1400 Undermining [Depth (cm)/Location] 0 01/10/23 1400   Wound 01/10/23 1447 Traumatic Left proximal knee (Active) Dressing Appearance intact 01/10/23 1400 Drainage Amount small 01/10/23 1400 Drainage Characteristics/Odor serosanguineous 01/10/23 1400 Base red 01/10/23 1400 Periwound intact 01/10/23 1400 Edges irregular 01/10/23 1400 Wound Length (cm) 4 cm (cluster of 2) 01/10/23 1400 Wound Width (cm) 4.7 cm 01/10/23 1400 Wound Depth (cm) 0.1 cm 01/10/23 1400 Wound Surface Area (cm^2) 18.8 cm^2 01/10/23 1400 Wound Volume (cm^3) 1.88 cm^3 01/10/23 1400 Tunneling [Depth (cm)/Location] 0 01/10/23 1400 Undermining [Depth (cm)/Location] 0 01/10/23 1400   Wound 01/10/23 1447 Traumatic Right knee (Active) Dressing Appearance intact 01/10/23 1400 Drainage Amount small 01/10/23 1400 Drainage Characteristics/Odor serosanguineous 01/10/23 1400 Base red 01/10/23 1400 Periwound intact 01/10/23 1400 Edges irregular 01/10/23 1400 Wound Length (cm) 6.3 cm (cluster of 2) 01/10/23 1400 Wound Width (cm) 2.5 cm 01/10/23 1400 Wound Depth (cm) 0.1 cm 01/10/23 1400 Wound Surface Area (cm^2) 15.75 cm^2 01/10/23 1400 Wound Volume (cm^3) 1.575 cm^3 01/10/23 1400   Wound 01/10/23 1447 Traumatic Right anterior leg (Active) Dressing Appearance intact 01/10/23 1400 Drainage Amount scant 01/10/23 1400 Drainage Characteristics/Odor serosanguineous 01/10/23 1400 Base unable to visualize 01/10/23 1400 Periwound intact;edematous 01/10/23 1400 Edges other (see comments) (approximated) 01/10/23 1400 Wound Length (cm) 13 cm 01/10/23 1400 Wound Width (cm) 9.3 cm 01/10/23 1400 Wound Depth (cm) 0.1 cm 01/10/23 1400 Wound Surface Area (cm^2) 120.9 cm^2 01/10/23 1400 Wound Volume (cm^3) 12.09 cm^3 01/10/23 1400 Tunneling [Depth (cm)/Location] 0 01/10/23 1400 Undermining [Depth (cm)/Location] 0 01/10/23 1400   Wound 01/10/23 1447 Traumatic Left elbow (Active) Dressing Appearance open to air 01/10/23 1400 Drainage Amount none 01/10/23 1400 Base scab 01/10/23 1400 Periwound intact 01/10/23 1400 Edges fixed 01/10/23 1400 Wound Length (cm) 2 cm 01/10/23 1400 Wound Width (cm) 1 cm 01/10/23 1400 Wound Depth (cm) 0.1 cm 01/10/23 1400 Wound Surface Area (cm^2) 2 cm^2 01/10/23 1400 Wound Volume (cm^3) 0.2 cm^3 01/10/23 1400 Tunneling [Depth (cm)/Location] 0 01/10/23 1400 Undermining [Depth (cm)/Location] 0 01/10/23 1400   Wound 01/10/23 1447 Traumatic Left hand (Active) Dressing Appearance open to air 01/10/23 1400 Drainage Amount  none 01/10/23 1400 Base scab 01/10/23 1400 Periwound intact 01/10/23 1400 Edges irregular 01/10/23 1400 Wound Length (cm) 0.7 cm 01/10/23 1400 Wound Width (cm) 0.6 cm 01/10/23 1400 Wound Depth (cm) 0.1 cm 01/10/23 1400 Wound Surface Area (cm^2) 0.42 cm^2 01/10/23 1400 Wound Volume (cm^3) 0.042 cm^3 01/10/23 1400 Tunneling [Depth (cm)/Location] 0 01/10/23 1400 Undermining [Depth (cm)/Location] 0 01/10/23 1400 Right anterior lower leg has large skin flap with proximally 30 sutures in place with wound edges well approximated with scant amount of odorless serosanguineous drainage.  There is minor ecchymosis with a small area of tissue breakdown at the inferior area of the flap.  The periwound tissue is without erythema, heat, induration or maceration.Left elbow and left hand has has a small partial-thickness skin tear with a scant amount of odorless serosanguineous drainage.  The periwound tissue is without erythema, induration, or maceration. No debridement/procedure today Labs and Studies:Albumin (g/dL) Date Value 16/07/9603 2.8 (L) Creatinine (mg/dL) Date Value 54/06/8118 0.94 Assessment and Plan Multiple open wounds with skin tear to left hand and left elbow, right lower leg complex skin tear laceration complicated by peripheral edemaNo signs of local wound infection or cellulitis.Dressing with focus on control of bacterial burden moist wound healing with gentle enzymatic debridement and lower extremity edema control:Right lower leg - Medihoney gel, Adaptic, ABD or alternative absorptive layer, and two-layer calamine light compression wrap. Bilateral knee, left elbow and left hand - Medihoney gel, Adaptic, dry protective cover dressing. Change 2 times weekly in the wound clinic as patient is not eligible for VNA.    Referral to Angelena Form, PT for collaborative careFollow up in 3 days for re-evaluation and managementPlan discussed with patient and/or family. YesSigned:Kyrin Gratz Earlene Plater, APRN3/28/2024

## 2023-01-13 ENCOUNTER — Inpatient Hospital Stay: Admit: 2023-01-13 | Discharge: 2023-01-13 | Payer: MEDICARE | Primary: Family

## 2023-01-13 DIAGNOSIS — R600 Localized edema: Secondary | ICD-10-CM

## 2023-01-13 NOTE — Progress Notes
Progress NoteCommunication Limitations: no limitations Subjective: HPI- initial visit on 01/10/23 following fall on 3/20 on a treadmill at the [facility].  She was seen in the ED, right lower leg skin flap laceration was sutured closed.  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/Hx: I have reviewed the patient's allergies, current scheduled medications, past medical history, past surgical history, social history, and prior to admission medicationsObjective: Temp:  [98.9 ?F (37.2 ?C)] 98.9 ?F (37.2 ?C)Pulse:  [80] 80Resp:  [16] 16BP: (131)/(69) 131/69SpO2:  [98 %] 98 %Wound 01/10/23 1446 Traumatic Left anterior knee (Active) Dressing Appearance intact 01/13/23 1300 Drainage Amount scant 01/13/23 1300 Drainage Characteristics/Odor serous 01/13/23 1300 Base pink 01/13/23 1300 Periwound intact 01/13/23 1300 Edges closed 01/13/23 1300 Wound Length (cm) 0 cm 01/13/23 1300 Wound Width (cm) 0 cm 01/13/23 1300 Wound Surface Area (cm^2) 0 cm^2 01/13/23 1300   Wound 01/10/23 1447 Traumatic Left proximal knee (Active) Dressing Appearance intact 01/13/23 1300 Wound Length (cm) 1.5 cm 01/13/23 1300 Wound Width (cm) 2.5 cm 01/13/23 1300 Wound Depth (cm) 0.1 cm 01/13/23 1300 Wound Surface Area (cm^2) 3.75 cm^2 01/13/23 1300 Wound Volume (cm^3) 0.375 cm^3 01/13/23 1300   Wound 01/10/23 1447 Traumatic Right knee (Active) Dressing Appearance intact 01/13/23 1300 Drainage Amount small 01/13/23 1300 Drainage Characteristics/Odor serous 01/13/23 1300 Base pink 01/13/23 1300 Periwound intact 01/13/23 1300 Edges closed 01/13/23 1300 Wound Length (cm) 0 cm 01/13/23 1300 Wound Width (cm) 0 cm 01/13/23 1300 Wound Depth (cm) 0 cm 01/13/23 1300 Wound Surface Area (cm^2) 0 cm^2 01/13/23 1300 Wound Volume (cm^3) 0 cm^3 01/13/23 1300   Wound 01/10/23 1447 Traumatic Right anterior leg (Active) Dressing Appearance intact 01/13/23 1300 Drainage Amount scant 01/13/23 1300 Drainage Characteristics/Odor serous 01/13/23 1300 Base unable to visualize 01/13/23 1300 Periwound intact;edematous 01/13/23 1300 Edges other (see comments) (approximated) 01/13/23 1300 Wound Length (cm) 13 cm 01/13/23 1300 Wound Width (cm) 9.3 cm 01/13/23 1300 Wound Depth (cm) 0.1 cm 01/13/23 1300 Wound Surface Area (cm^2) 120.9 cm^2 01/13/23 1300 Wound Volume (cm^3) 12.09 cm^3 01/13/23 1300   Wound 01/10/23 1447 Traumatic Left elbow (Active) Dressing Appearance intact 01/13/23 1300 Drainage Amount scant 01/13/23 1300 Base pink 01/13/23 1300 Periwound intact 01/13/23 1300 Edges closed 01/13/23 1300 Wound Length (cm) 0 cm 01/13/23 1300 Wound Width (cm) 0 cm 01/13/23 1300 Wound Depth (cm) 0 cm 01/13/23 1300 Wound Surface Area (cm^2) 0 cm^2 01/13/23 1300 Wound Volume (cm^3) 0 cm^3 01/13/23 1300   Wound 01/10/23 1447 Traumatic Left hand (Active) Dressing Appearance intact 01/13/23 1300 Drainage Amount none 01/13/23 1300 Base pink 01/13/23 1300 Periwound intact 01/13/23 1300 Edges closed 01/13/23 1300 Wound Length (cm) 0 cm 01/13/23 1300 Wound Width (cm) 0 cm 01/13/23 1300 Wound Depth (cm) 0 cm 01/13/23 1300 Wound Surface Area (cm^2) 0 cm^2 01/13/23 1300 Wound Volume (cm^3) 0 cm^3 01/13/23 1300 Tunneling [Depth (cm)/Location] 0 01/13/23 1300 Undermining [Depth (cm)/Location] 0 01/13/23 1300     Left hand, elbow, knee Right knee, lower legPhysical Exam: Physical ExamRight lower leg skin tear- sutures in place, edges approximated, no drainage present.  Multiple skin tears and abrasions over knees, elbow, hand are closed.  Assessment: Sarah Montes is a 89 y.o. femaleVisit Diagnosis:   SNOMED [place](R) 1. Ulcer of right lower leg, with unspecified severity (HC Code)  ULCER OF RIGHT LOWER LEG 2. Multiple open wounds  MULTIPLE OPEN WOUNDS 3. Peripheral edema  PERIPHERAL EDEMA  Multiple skin tears related to fall on treadmill on 3/20  are all significantly improved.  Plan: Aquaphor was applied to all closed skin tears and abrasionsSilver contact layer was applied to right lwoer leg with 2-layer lite compression wrapFollow up next week on 4/2- anticipate sutures come out next weekFollow up next week for wound evaluation and management.On the date of this encounter, a total of 25 minutes was personally spent by me in pre charting, documentation, reviewing interval history, reviewing available data in epic including history, consult notes, imaging, interpreting pertinent lab and test results, obtaining and reviewing history, physical examination, counseling, and explaining planned testing, and coordination of care. This does not include any resident/fellow teaching time, or any time spent performing a procedural service.Wound 01/10/23 1446 Traumatic Left anterior knee (Active)   No associated orders.   Wound 01/10/23 1447 Traumatic Left proximal knee (Active)   No associated orders.   Wound 01/10/23 1447 Traumatic Right knee (Active)   No associated orders.   Wound 01/10/23 1447 Traumatic Right anterior leg (Active)   No associated orders.   Wound 01/10/23 1447 Traumatic Left elbow (Active)   No associated orders.   Wound 01/10/23 1447 Traumatic Left hand (Active)   No associated orders. ProceduresNASigned:Andranik Jeune, APRN 01/13/2023

## 2023-01-15 DIAGNOSIS — S80212D Abrasion, left knee, subsequent encounter: Secondary | ICD-10-CM

## 2023-01-15 DIAGNOSIS — T07XXXA Unspecified multiple injuries, initial encounter: Secondary | ICD-10-CM

## 2023-01-15 DIAGNOSIS — L97919 Non-pressure chronic ulcer of unspecified part of right lower leg with unspecified severity: Secondary | ICD-10-CM

## 2023-01-15 DIAGNOSIS — R609 Edema, unspecified: Secondary | ICD-10-CM

## 2023-01-17 ENCOUNTER — Inpatient Hospital Stay: Admit: 2023-01-17 | Discharge: 2023-01-17 | Payer: MEDICARE | Primary: Family

## 2023-01-17 ENCOUNTER — Encounter: Admit: 2023-01-17 | Payer: PRIVATE HEALTH INSURANCE | Attending: Acute Care | Primary: Family

## 2023-01-17 DIAGNOSIS — L97919 Non-pressure chronic ulcer of unspecified part of right lower leg with unspecified severity: Secondary | ICD-10-CM

## 2023-01-17 DIAGNOSIS — H3530 Unspecified macular degeneration: Secondary | ICD-10-CM

## 2023-01-17 DIAGNOSIS — T07XXXA Unspecified multiple injuries, initial encounter: Secondary | ICD-10-CM

## 2023-01-17 DIAGNOSIS — R87619 Unspecified abnormal cytological findings in specimens from cervix uteri: Secondary | ICD-10-CM

## 2023-01-17 DIAGNOSIS — R600 Localized edema: Secondary | ICD-10-CM

## 2023-01-17 DIAGNOSIS — H409 Unspecified glaucoma: Secondary | ICD-10-CM

## 2023-01-20 ENCOUNTER — Inpatient Hospital Stay: Admit: 2023-01-20 | Discharge: 2023-01-20 | Payer: MEDICARE | Primary: Family

## 2023-01-20 ENCOUNTER — Encounter: Admit: 2023-01-20 | Payer: PRIVATE HEALTH INSURANCE | Attending: Internal Medicine | Primary: Family

## 2023-01-20 DIAGNOSIS — R87619 Unspecified abnormal cytological findings in specimens from cervix uteri: Secondary | ICD-10-CM

## 2023-01-20 DIAGNOSIS — H409 Unspecified glaucoma: Secondary | ICD-10-CM

## 2023-01-20 DIAGNOSIS — H3530 Unspecified macular degeneration: Secondary | ICD-10-CM

## 2023-01-20 DIAGNOSIS — L97919 Non-pressure chronic ulcer of unspecified part of right lower leg with unspecified severity: Secondary | ICD-10-CM

## 2023-01-20 DIAGNOSIS — T07XXXA Unspecified multiple injuries, initial encounter: Secondary | ICD-10-CM

## 2023-01-20 NOTE — Progress Notes
Progress Note Communication Limitations: no limitations Visit diagnosis:   SNOMED [place](R) 1. Ulcer of right lower leg, with unspecified severity (HC Code)  ULCER OF RIGHT LOWER LEG 2. Multiple open wounds  MULTIPLE OPEN WOUNDS 3. Peripheral edema  PERIPHERAL EDEMA Subjective: Chief complaint:  right lower leg wound HPI: This is a 89 y.o. female with PMH significant for glaucoma and macular degeneration.  She lives alone.  She initially presents today 01/10/2023 for evaluation of skin tear to right shin, left hand, left elbow, and left knee secondary to fall from a treadmill at the [facility].  She was seen in the ED on 01/04/23 with multiple sutures placed to right lower leg skin flap laceration.  Bilateral knee and bilateral tib-fib x-ray showed no fracture dislocation.  Today 01/17/23 she presents alone for routine follow-up with no new complaints.  He arrives with right lower leg dressing in place and dressing to left knee with no strike through drainage.  She reports some minor occasional pain to right lower leg laceration site which is tolerable.  She reports no left knee wound pain.  She denies any fever or signs of infection.  She is tolerating compression therapy.   Review of Allergies/Meds/Hx: I have reviewed the patient's allergies, current scheduled medications, current prn medications, past medical history, past surgical history, and social historyMedical History: PMH PSH Past Medical History: Diagnosis Date  Abnormal cervical Papanicolaou smear   Glaucoma   Macular degeneration disease   Past Surgical History: Procedure Laterality Date  BLADDER SUSPENSION    CATARACT EXTRACTION    HYSTERECTOMY    Social History Family History Social History Socioeconomic History  Marital status: Widowed   Spouse name: Not on file  Number of children: Not on file  Years of education: Not on file  Highest education level: Not on file Occupational History  Not on file Tobacco Use  Smoking status: Never  Smokeless tobacco: Not on file Vaping Use  Vaping Use: Never used Substance and Sexual Activity  Alcohol use: Yes   Comment: rare  Drug use: Not on file  Sexual activity: Not Currently Other Topics Concern  Not on file Social History Narrative  Not on file Social Determinants of Health Financial Resource Strain: Low Risk  (06/10/2022)  Overall Financial Resource Strain (CARDIA)   Difficulty of Paying Living Expenses: Not hard at all Food Insecurity: No Food Insecurity (06/10/2022)  Hunger Vital Sign   Worried About Running Out of Food in the Last Year: Never true   Ran Out of Food in the Last Year: Never true Transportation Needs: No Transportation Needs (06/10/2022)  PRAPARE - Designer, jewellery (Medical): No   Lack of Transportation (Non-Medical): No Physical Activity: Not on file Stress: Not on file Social Connections: Not on file Intimate Partner Violence: Not on file Housing Stability: Low Risk  (06/10/2022)  Housing Stability   Housing Stability: I have a steady place to live   Housing Stability: Not on file  Family History Problem Relation Age of Onset  Breast cancer Mother   Breast cancer Maternal Aunt   Ovarian cancer Maternal Grandmother   Allergies Allergies Allergen Reactions  Adhesive Rash   TapeTapeTapeTape  Alendronate GI Upset, Nausea And Vomiting and Other (See Comments)  Alendronic Acid Nausea And Vomiting  Atorvastatin Other (See Comments) and Unknown   Elevated ckElevated ckElevated ckOther reaction(s): Other (See Comments)Elevated ckElevated ckElevated ck  Penicillins Other (See Comments)  Phenylalanine Other (See Comments)   Other reaction(s): Other (See Comments)  Tramadol Hallucinations  Oxycodone Rash  Objective: Vitals:  01/17/23 1523 BP: (!) 153/75 Pulse: 75 Resp: 18 Temp: (!) 96.8 ?F (36 ?C) Well-appearing with no diaphoresis and no appearance of acute distress.  She is alert and oriented with pleasant affect RLE: trace edema.  Mild hemosiderin staining and varicosities.  DP and PT pulse is softly palpable and biphasic on Doppler.Wound ExamWound 01/10/23 1447 Traumatic Left proximal knee (Active) Dressing Appearance intact;dried drainage 01/17/23 1500 Drainage Amount scant 01/17/23 1500 Drainage Characteristics/Odor serosanguineous 01/17/23 1500 Base scab 01/17/23 1500 Periwound intact 01/17/23 1500 Edges irregular 01/17/23 1500 Wound Length (cm) 1.5 cm 01/17/23 1500 Wound Width (cm) 2.8 cm 01/17/23 1500 Wound Depth (cm) 0 cm 01/17/23 1500 Wound Surface Area (cm^2) 4.2 cm^2 01/17/23 1500 Wound Volume (cm^3) 0 cm^3 01/17/23 1500 Tunneling [Depth (cm)/Location] 0 01/17/23 1500 Undermining [Depth (cm)/Location] 0 01/17/23 1500   Wound 01/10/23 1447 Traumatic Right anterior leg (Active) Dressing Appearance intact;no drainage 01/17/23 1500 Drainage Amount none 01/17/23 1500 Drainage Characteristics/Odor serous 01/13/23 1300 Base scab 01/17/23 1500 Periwound intact 01/17/23 1500 Edges irregular;other (see comments) (sutures) 01/17/23 1500 Wound Length (cm) 13 cm 01/17/23 1500 Wound Width (cm) 10 cm 01/17/23 1500 Wound Depth (cm) 0 cm 01/17/23 1500 Wound Surface Area (cm^2) 130 cm^2 01/17/23 1500 Wound Volume (cm^3) 0 cm^3 01/17/23 1500 Tunneling [Depth (cm)/Location] 0 01/17/23 1500 Undermining [Depth (cm)/Location] 0 01/17/23 1500 Right anterior lower leg has large skin flap with sutures in place with laceration well approximated and small area of tissue breakdown at the inferior area of the flap which appears to be healing.  There is a scant amount of serous drainage.  The periwound tissue is without erythema, heat, induration or maceration. S/p partial suture removal Labs and Studies:Albumin (g/dL) Date Value 16/07/9603 2.8 (L) Creatinine (mg/dL) Date Value 54/06/8118 0.94 Assessment and Plan Multiple open wound skin tears to upper and lower extremity with right lower leg complex skin tear laceration complicated by peripheral edema No signs of local wound infection or cellulitis.  The lower leg laceration shows excellent progress towards wound healing with good approximation of incision line.Every other suture removed - consider removal of final sutures end of this week or next week.   Dressing updated: Right lower leg - triamcinolone to itchy areas of periwound.  Then to incision site apply Medihoney gel, Adaptic, ABD pad and two-layer calamine light compression wrap.  Change 2 times weekly.Follow up in 3 days with wound provider for re-evaluation and management.Signed:Rayvon Dakin Earlene Plater, APRN4/11/2022

## 2023-01-20 NOTE — Progress Notes
Progress NoteCommunication Limitations: no limitations Subjective: HPI- initial visit on 01/10/23 following fall on 3/20 on a treadmill at the [facility].  She was seen in the ED, right lower leg skin flap laceration was sutured closed.  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/Hx: I have reviewed the patient's allergies, current scheduled medications, past medical history, past surgical history, social history, and prior to admission medicationsObjective: Temp:  [98.1 ?F (36.7 ?C)] 98.1 ?F (36.7 ?C)Pulse:  [79] 79Resp:  [16] 16BP: (131)/(72) 131/72SpO2:  [97 %] 97 %Wound 01/10/23 1447 Traumatic Right anterior leg (Active) Dressing Appearance intact;no drainage 01/20/23 1400 Drainage Amount none 01/20/23 1400 Base scab (sutures removed by provider) 01/20/23 1400 Periwound intact 01/20/23 1400 Edges irregular 01/20/23 1400 Wound Length (cm) 8.5 cm 01/20/23 1400 Wound Width (cm) 7.5 cm 01/20/23 1400 Wound Depth (cm) 0.1 cm 01/20/23 1400 Wound Surface Area (cm^2) 63.75 cm^2 01/20/23 1400 Wound Volume (cm^3) 6.375 cm^3 01/20/23 1400   [REMOVED] Wound 01/10/23 1447 Traumatic Left proximal knee (Resolved) Edges closed 01/20/23 1400 Wound Length (cm) 0 cm 01/20/23 1400 Wound Width (cm) 0 cm 01/20/23 1400 Wound Depth (cm) 0 cm 01/20/23 1400 Wound Surface Area (cm^2) 0 cm^2 01/20/23 1400 Wound Volume (cm^3) 0 cm^3 01/20/23 1400     Physical Exam: Physical ExamRight lower leg skin tear- sutures in place, edges approximated, removed without difficulty no drainage present.  Multiple skin tears and abrasions over knees, elbow, hand are closed.  Assessment: Sarah Montes is a 89 y.o. femaleVisit Diagnosis:   SNOMED Sullivan(R) 1. Ulcer of right lower leg, with unspecified severity (HC Code)  ULCER OF RIGHT LOWER LEG  Multiple skin tears related to fall on treadmill on 3/20 are all significantly improved. Minimal drainage, tolerated suture removal.   Plan: Aquaphor was applied to all closed skin tears and abrasionsTriamcinolone to pruritic areas periwound.Silver contact layer was applied to right lwoer leg with 2-layer lite compression wrapFollow up next week for wound evaluation and management.On the date of this encounter, a total of 25 minutes was personally spent by me in pre charting, documentation, reviewing interval history, reviewing available data in epic including history, consult notes, imaging, interpreting pertinent lab and test results, obtaining and reviewing history, physical examination, counseling, and explaining planned testing, and coordination of care. This does not include any resident/fellow teaching time, or any time spent performing a procedural service.Wound 01/10/23 1447 Traumatic Right anterior leg (Active)   No associated orders.   [REMOVED] Wound 01/10/23 1447 Traumatic Left proximal knee (Resolved)   No associated orders. ProceduresNASigned:Herschel Fleagle, APRN 01/20/2023

## 2023-01-24 ENCOUNTER — Inpatient Hospital Stay: Admit: 2023-01-24 | Discharge: 2023-01-24 | Payer: MEDICARE | Primary: Family

## 2023-01-24 DIAGNOSIS — L97919 Non-pressure chronic ulcer of unspecified part of right lower leg with unspecified severity: Secondary | ICD-10-CM

## 2023-01-24 DIAGNOSIS — R600 Localized edema: Secondary | ICD-10-CM

## 2023-01-24 NOTE — Progress Notes
Progress NoteCommunication Limitations: no limitations Subjective: HPI- initial visit on 01/10/23 following fall on 3/20 on a treadmill at the [facility].  She was seen in the ED, right lower leg skin flap laceration was sutured closed.  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  Review of Allergies/Meds/Hx: I have reviewed the patient's allergies, current scheduled medications, past medical history, past surgical history, social history, and prior to admission medicationsObjective: Temp:  [98.3 ?F (36.8 ?C)] 98.3 ?F (36.8 ?C)Pulse:  [77] 77Resp:  [16] 16BP: (150)/(70) 150/70SpO2:  [98 %] 98 %Wound 01/10/23 1447 Traumatic Right anterior leg (Active) Dressing Appearance intact;dried drainage 01/24/23 1400 Drainage Amount scant 01/24/23 1400 Drainage Characteristics/Odor serosanguineous 01/24/23 1400 Base scab 01/24/23 1400 Periwound intact 01/24/23 1400 Edges irregular 01/24/23 1400 Wound Length (cm) 5.5 cm 01/24/23 1400 Wound Width (cm) 7 cm 01/24/23 1400 Wound Depth (cm) 0.1 cm 01/24/23 1400 Wound Surface Area (cm^2) 38.5 cm^2 01/24/23 1400 Wound Volume (cm^3) 3.85 cm^3 01/24/23 1400     Physical Exam: Physical ExamRight lower leg skin tear- edges approximated, fibrin over wound edges no drainage present.  Multiple skin tears and abrasions over knees, elbow, hand are closed.  Assessment: Sarah Montes is a 89 y.o. femaleVisit Diagnosis:   SNOMED Meta(R) 1. Ulcer of right lower leg, with unspecified severity (HC Code)  ULCER OF RIGHT LOWER LEG 2. Peripheral edema  PERIPHERAL EDEMA  Multiple skin tears related to fall on treadmill on 3/20 are closed.  Right lower leg is closed, still c/o intermittent pain, pruritus.  May benefit from vascular evaluation.   Plan: Aquaphor was applied to right lower leg wound followed by Spandigrip She may apply compression stocking at home, on in morning off at nightReferred to [facility] to evaluate pain in right leg.She may apply OTC cortisone cream to pruritic areas, call the clinic if this is not sufficient.Follow up in two weeks.  If wounds remain closed, she may return to clinic as needed. On the date of this encounter, a total of 20 minutes was personally spent by me in pre charting, documentation, reviewing interval history, reviewing available data in epic including history, consult notes, imaging, interpreting pertinent lab and test results, obtaining and reviewing history, physical examination, counseling, and explaining planned testing, and coordination of care. This does not include any resident/fellow teaching time, or any time spent performing a procedural service.Wound 01/10/23 1447 Traumatic Right anterior leg (Active)   No associated orders. ProceduresNASigned:Thorsten Climer, APRN 01/24/2023

## 2023-01-26 ENCOUNTER — Telehealth
Admit: 2023-01-26 | Payer: PRIVATE HEALTH INSURANCE | Attending: Student in an Organized Health Care Education/Training Program | Primary: Family

## 2023-01-26 NOTE — Telephone Encounter
TC from pt stating that she noted some swelling along the suture line of her wound only. No generalized edema. She feels area with no s/s of infection. Advised difficult to tell etiology over the telephone and offered appt for tomorrow. Pt agreeable. Discussed s/s to report to ED or any wound decline and pt verbalizes understanding but feels stable at this time. Pt provided with visit tomorrow for recheck.

## 2023-01-27 ENCOUNTER — Encounter: Admit: 2023-01-27 | Payer: PRIVATE HEALTH INSURANCE | Attending: Internal Medicine | Primary: Family

## 2023-01-27 ENCOUNTER — Inpatient Hospital Stay: Admit: 2023-01-27 | Discharge: 2023-01-27 | Payer: MEDICARE | Primary: Family

## 2023-01-27 DIAGNOSIS — R87619 Unspecified abnormal cytological findings in specimens from cervix uteri: Secondary | ICD-10-CM

## 2023-01-27 DIAGNOSIS — L97919 Non-pressure chronic ulcer of unspecified part of right lower leg with unspecified severity: Secondary | ICD-10-CM

## 2023-01-27 DIAGNOSIS — H409 Unspecified glaucoma: Secondary | ICD-10-CM

## 2023-01-27 DIAGNOSIS — H3530 Unspecified macular degeneration: Secondary | ICD-10-CM

## 2023-01-27 NOTE — Progress Notes
Progress NoteCommunication Limitations: no limitations Subjective: Chief complaint- right lower leg woundHPI- initial visit on 01/10/23 following fall on 3/20 on a treadmill at the [facility].  She was seen in the ED, right lower leg skin flap laceration was sutured closed.  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  She is here to have it evaluated.  Review of Allergies/Meds/Hx: I have reviewed the patient's allergies, current scheduled medications, past medical history, past surgical history, social history, and prior to admission medicationsObjective: Temp:  [96.8 ?F (36 ?C)] 96.8 ?F (36 ?C)Pulse:  [66] 66Resp:  [18] 18BP: (132)/(75) 132/75SpO2:  [96 %] 96 %Wound 01/10/23 1447 Traumatic Right anterior leg (Active) Dressing Appearance intact;open to air 01/27/23 1100 Drainage Amount none 01/27/23 1100 Base scab 01/27/23 1100 Periwound intact 01/27/23 1100 Edges closed 01/27/23 1100 Wound Length (cm) 0.1 cm 01/27/23 1100 Wound Width (cm) 0.1 cm 01/27/23 1100 Wound Depth (cm) 0.1 cm 01/27/23 1100 Wound Surface Area (cm^2) 0.01 cm^2 01/27/23 1100 Wound Volume (cm^3) 0.001 cm^3 01/27/23 1100     Physical Exam: Physical ExamRight lower leg skin tear- edges approximated, fibrin over wound edges no drainage present. There is mild fluctuation within the healed wound without tension.    Multiple skin tears and abrasions over knees, elbow, hand are closed.  Assessment: AZILEE TUBBY is a 89 y.o. femaleVisit Diagnosis:   SNOMED Marion(R) 1. Ulcer of right lower leg, with unspecified severity (HC Code)  ULCER OF RIGHT LOWER LEG  Right lower leg is closed, still c/o intermittent pain, pruritus.  She is complaining of localized swelling, noted some fluctuance deep within wound.    May benefit from vascular evaluation.   Plan: Aquaphor was applied to right lower leg wound, dry protective dressing for [id number] compression stocking applied, on in the morning off at nightPending initial visit with [facility] on 4/19Follow up in two weeks.  If wounds remain closed, she may return to clinic as needed. On the date of this encounter, a total of 20 minutes was personally spent by me in pre charting, documentation, reviewing interval history, reviewing available data in epic including history, consult notes, imaging, interpreting pertinent lab and test results, obtaining and reviewing history, physical examination, counseling, and explaining planned testing, and coordination of care. This does not include any resident/fellow teaching time, or any time spent performing a procedural service.Wound 01/10/23 1447 Traumatic Right anterior leg (Active)   No associated orders. ProceduresNASigned:Renada Cronin, APRN 01/27/2023

## 2023-02-08 ENCOUNTER — Inpatient Hospital Stay: Admit: 2023-02-08 | Discharge: 2023-02-08 | Payer: MEDICARE | Primary: Family

## 2023-02-08 DIAGNOSIS — R600 Localized edema: Secondary | ICD-10-CM

## 2023-02-08 DIAGNOSIS — L97919 Non-pressure chronic ulcer of unspecified part of right lower leg with unspecified severity: Secondary | ICD-10-CM

## 2023-02-08 NOTE — Progress Notes
Progress NoteCommunication Limitations: no limitations Subjective: Chief complaint- right lower leg woundHPI- initial visit on 01/10/23 following fall on 3/20 on a treadmill at the [facility].  She was seen in the ED, right lower leg skin flap laceration was sutured closed.  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  She has no further wounds.  She brought compression stockings with her. Review of Allergies/Meds/Hx: I have reviewed the patient's allergies, current scheduled medications, past medical history, past surgical history, social history, and prior to admission medicationsObjective: Temp:  [97.3 ?F (36.3 ?C)] 97.3 ?F (36.3 ?C)Pulse:  [63] 63Resp:  [16] 16BP: (117)/(76) 117/76SpO2:  [99 %] 99 %Wound 01/10/23 1447 Traumatic Right anterior leg (Active) Dressing Appearance intact;open to air 02/08/23 1000 Drainage Amount none 02/08/23 1000 Base scab 02/08/23 1000 Periwound intact 02/08/23 1000 Edges closed 02/08/23 1000     Physical Exam: Physical ExamRight lower leg skin tear- edges approximated, fibrin over wound edges no drainage present. Assessment: Sarah Montes is a 89 y.o. femaleVisit Diagnosis:   SNOMED Grandin(R) 1. Ulcer of right lower leg, with unspecified severity (HC Code)  ULCER OF RIGHT LOWER LEG 2. Peripheral edema  PERIPHERAL EDEMA  Right lower leg is closed.  She is complaining of localized swelling, without fluctuance.  Plan: Discussed options for compressionShe is welcome to return to clinic as needed.On the date of this encounter, a total of 20 minutes was personally spent by me in pre charting, documentation, reviewing interval history, reviewing available data in epic including history, consult notes, imaging, interpreting pertinent lab and test results, obtaining and reviewing history, physical examination, counseling, and explaining planned testing, and coordination of care. This does not include any resident/fellow teaching time, or any time spent performing a procedural service.Wound 01/10/23 1447 Traumatic Right anterior leg (Active)   No associated orders. ProceduresNASigned:Goran Olden, APRN 02/08/2023

## 2023-03-09 ENCOUNTER — Encounter: Admit: 2023-03-09 | Payer: PRIVATE HEALTH INSURANCE | Attending: Obstetrics and Gynecology | Primary: Family

## 2023-03-09 MED ORDER — VALACYCLOVIR 500 MG TABLET
500 | ORAL_TABLET | Freq: Two times a day (BID) | ORAL | 1 refills | 10.00000 days | Status: AC
Start: 2023-03-09 — End: ?

## 2023-04-26 ENCOUNTER — Inpatient Hospital Stay: Admit: 2023-04-26 | Discharge: 2023-04-26 | Payer: MEDICARE | Primary: Family

## 2023-04-26 ENCOUNTER — Encounter: Admit: 2023-04-26 | Payer: PRIVATE HEALTH INSURANCE | Attending: Family | Primary: Family

## 2023-04-26 DIAGNOSIS — E782 Mixed hyperlipidemia: Secondary | ICD-10-CM

## 2023-04-26 DIAGNOSIS — E559 Vitamin D deficiency, unspecified: Secondary | ICD-10-CM

## 2023-04-26 DIAGNOSIS — Z0000 Encounter for general adult medical examination without abnormal findings: Secondary | ICD-10-CM

## 2023-04-26 DIAGNOSIS — D61818 Other pancytopenia: Secondary | ICD-10-CM

## 2023-04-26 DIAGNOSIS — M2550 Pain in unspecified joint: Secondary | ICD-10-CM

## 2023-04-26 LAB — CBC WITH AUTO DIFFERENTIAL
BKR WAM ABSOLUTE IMMATURE GRANULOCYTES.: 0.01 x 1000/ÂµL (ref 0.00–0.30)
BKR WAM ABSOLUTE LYMPHOCYTE COUNT.: 3.23 x 1000/ÂµL (ref 0.60–3.70)
BKR WAM ABSOLUTE NRBC (2 DEC): 0 x 1000/ÂµL (ref 0.00–1.00)
BKR WAM ANC (ABSOLUTE NEUTROPHIL COUNT): 2.51 x 1000/ÂµL (ref 2.00–7.60)
BKR WAM BASOPHIL ABSOLUTE COUNT.: 0.05 x 1000/ÂµL (ref 0.00–1.00)
BKR WAM BASOPHILS: 0.8 % (ref 0.2–1.0)
BKR WAM EOSINOPHIL ABSOLUTE COUNT.: 0.28 x 1000/ÂµL (ref 0.00–1.00)
BKR WAM EOSINOPHILS: 4.2 % (ref 0.0–5.0)
BKR WAM HEMATOCRIT (2 DEC): 37.1 % (ref 35.00–45.00)
BKR WAM HEMOGLOBIN: 11.9 g/dL (ref 11.7–15.5)
BKR WAM IMMATURE GRANULOCYTES: 0.2 % (ref 45–117)
BKR WAM LYMPHOCYTES: 48.8 % (ref 6.4–8.2)
BKR WAM MCH (PG): 31.3 pg (ref 27.0–33.0)
BKR WAM MCHC: 32.1 g/dL — ABNORMAL LOW (ref 5–15)
BKR WAM MCV: 97.6 fL (ref 80.0–100.0)
BKR WAM MONOCYTE ABSOLUTE COUNT.: 0.54 x 1000/ÂµL — ABNORMAL LOW (ref 0.00–1.00)
BKR WAM MONOCYTES: 8.2 % — ABNORMAL LOW (ref 4.0–12.0)
BKR WAM MPV: 10 fL (ref 8.0–12.0)
BKR WAM NEUTROPHILS: 37.8 % — ABNORMAL LOW (ref 39.0–72.0)
BKR WAM NUCLEATED RED BLOOD CELLS: 0 % (ref 0.0–1.0)
BKR WAM PLATELETS: 226 x1000/ÂµL (ref 150–420)
BKR WAM RDW-CV: 13.5 % (ref 11.0–15.0)
BKR WAM RED BLOOD CELL COUNT.: 3.8 M/ÂµL — ABNORMAL LOW (ref 4.00–6.00)
BKR WAM WHITE BLOOD CELL COUNT: 6.6 x1000/ÂµL (ref 4.0–11.0)

## 2023-04-26 LAB — LIPID PANEL
BKR CHOLESTEROL: 235 mg/dL — ABNORMAL HIGH
BKR HDL CHOLESTEROL: 92 mg/dL — ABNORMAL HIGH
BKR LDL CHOLESTEROL SAMPSON CALCULATED: 121 mg/dL — ABNORMAL HIGH (ref <=2.0)
BKR TRIGLYCERIDES: 128 mg/dL

## 2023-04-26 LAB — IRON AND TIBC
BKR IRON SATURATION (SCCCT BH GH LM): 28 % (ref 27–40)
BKR IRON: 83 ug/dL (ref 50–170)
BKR TOTAL IRON BINDING CAPACITY (SCCCT  BH GH LM): 298 ug/dL (ref 250–450)

## 2023-04-26 LAB — COMPREHENSIVE METABOLIC PANEL
BKR ALANINE AMINOTRANSFERASE (ALT): 21 U/L (ref 12–78)
BKR ALBUMIN: 3.3 g/dL — ABNORMAL LOW (ref 3.4–5.0)
BKR ALKALINE PHOSPHATASE: 116 U/L (ref 45–117)
BKR ANION GAP (LM): 4 mmol/L — ABNORMAL LOW (ref 5–15)
BKR ASPARTATE AMINOTRANSFERASE (AST): 21 U/L (ref 15–37)
BKR BILIRUBIN TOTAL: 0.3 mg/dL (ref 0.2–1.0)
BKR BLOOD UREA NITROGEN: 23 mg/dL — ABNORMAL HIGH (ref 7–18)
BKR CALCIUM: 9.4 mg/dL (ref 8.5–10.1)
BKR CHLORIDE: 105 mmol/L (ref 98–107)
BKR CO2: 30 mmol/L (ref 21–32)
BKR CREATININE: 0.96 mg/dL (ref 0.55–1.02)
BKR EGFR, CREATININE (CKD-EPI 2021): 57 mL/min/{1.73_m2} — ABNORMAL LOW (ref >=60)
BKR GLOBULIN: 3.9 g/dL (ref 2.5–5.0)
BKR GLUCOSE: 96 mg/dL (ref 65–110)
BKR POTASSIUM: 4.7 mmol/L (ref 3.5–5.1)
BKR PROTEIN TOTAL: 7.2 g/dL (ref 6.4–8.2)
BKR SODIUM: 139 mmol/L (ref 136–145)

## 2023-04-26 LAB — VITAMIN D, 25-HYDROXY: BKR VITAMIN D 25-HYDROXY TOTAL: 70 ng/mL

## 2023-04-26 LAB — URINALYSIS-MACROSCOPIC W/REFLEX MICROSCOPIC
BKR BILIRUBIN, UA: NEGATIVE
BKR BLOOD, UA: NEGATIVE
BKR GLUCOSE, UA: NEGATIVE
BKR KETONES, UA: NEGATIVE
BKR LEUKOCYTE ESTERASE, UA: NEGATIVE
BKR NITRITE, UA: NEGATIVE
BKR PH, UA: 7 (ref 5.5–7.5)
BKR PROTEIN, UA: NEGATIVE %
BKR SPECIFIC GRAVITY, UA: 1.013 (ref 1.005–1.030)
BKR UROBILINOGEN, UA (MG/DL): <2 mg/dL (ref <=2.0)

## 2023-10-16 ENCOUNTER — Encounter: Admit: 2023-10-16 | Payer: MEDICARE | Attending: Obstetrics and Gynecology | Primary: Family

## 2023-11-08 ENCOUNTER — Ambulatory Visit: Admit: 2023-11-08 | Payer: MEDICARE | Attending: Obstetrics and Gynecology | Primary: Family

## 2023-11-08 ENCOUNTER — Encounter: Admit: 2023-11-08 | Payer: PRIVATE HEALTH INSURANCE | Attending: Obstetrics and Gynecology | Primary: Family

## 2023-11-08 VITALS — BP 118/60 | Ht 63.0 in | Wt 126.0 lb

## 2023-11-08 DIAGNOSIS — H409 Unspecified glaucoma: Secondary | ICD-10-CM

## 2023-11-08 DIAGNOSIS — Z8041 Family history of malignant neoplasm of ovary: Secondary | ICD-10-CM

## 2023-11-08 DIAGNOSIS — Z01419 Encounter for gynecological examination (general) (routine) without abnormal findings: Secondary | ICD-10-CM

## 2023-11-08 DIAGNOSIS — H3530 Unspecified macular degeneration: Secondary | ICD-10-CM

## 2023-11-08 DIAGNOSIS — R87619 Unspecified abnormal cytological findings in specimens from cervix uteri: Secondary | ICD-10-CM

## 2023-11-08 DIAGNOSIS — Z1239 Encounter for other screening for malignant neoplasm of breast: Secondary | ICD-10-CM

## 2023-11-08 MED ORDER — DUTASTERIDE 0.5 MG CAPSULE
0.5 | Freq: Every day | ORAL | 4.00 refills | 90.00000 days | Status: AC
Start: 2023-11-08 — End: 2023-11-08

## 2023-11-08 MED ORDER — ESCITALOPRAM 10 MG TABLET
10 | ORAL | 2.00 refills | 90.00000 days | Status: AC
Start: 2023-11-08 — End: 2023-11-08

## 2023-11-16 ENCOUNTER — Encounter
Admit: 2023-11-16 | Payer: PRIVATE HEALTH INSURANCE | Attending: Student in an Organized Health Care Education/Training Program | Primary: Family

## 2023-11-16 DIAGNOSIS — N3941 Urge incontinence: Secondary | ICD-10-CM

## 2023-12-12 ENCOUNTER — Encounter: Admit: 2023-12-12 | Payer: PRIVATE HEALTH INSURANCE | Attending: Obstetrics and Gynecology | Primary: Family

## 2023-12-12 DIAGNOSIS — Z8041 Family history of malignant neoplasm of ovary: Secondary | ICD-10-CM

## 2023-12-12 DIAGNOSIS — Z1231 Encounter for screening mammogram for malignant neoplasm of breast: Secondary | ICD-10-CM

## 2023-12-12 DIAGNOSIS — Z01419 Encounter for gynecological examination (general) (routine) without abnormal findings: Secondary | ICD-10-CM

## 2023-12-15 ENCOUNTER — Inpatient Hospital Stay: Admit: 2023-12-15 | Discharge: 2023-12-15 | Payer: PRIVATE HEALTH INSURANCE | Primary: Family

## 2023-12-15 ENCOUNTER — Encounter: Admit: 2023-12-15 | Payer: PRIVATE HEALTH INSURANCE | Attending: Obstetrics and Gynecology | Primary: Family

## 2023-12-15 ENCOUNTER — Encounter: Admit: 2023-12-15 | Payer: PRIVATE HEALTH INSURANCE | Primary: Family

## 2023-12-15 DIAGNOSIS — Z8041 Family history of malignant neoplasm of ovary: Secondary | ICD-10-CM

## 2023-12-15 DIAGNOSIS — H409 Unspecified glaucoma: Secondary | ICD-10-CM

## 2023-12-15 DIAGNOSIS — Z1231 Encounter for screening mammogram for malignant neoplasm of breast: Secondary | ICD-10-CM

## 2023-12-15 DIAGNOSIS — Z1239 Encounter for other screening for malignant neoplasm of breast: Secondary | ICD-10-CM

## 2023-12-15 DIAGNOSIS — H3530 Unspecified macular degeneration: Secondary | ICD-10-CM

## 2023-12-15 DIAGNOSIS — R87619 Unspecified abnormal cytological findings in specimens from cervix uteri: Secondary | ICD-10-CM

## 2023-12-15 DIAGNOSIS — Z01419 Encounter for gynecological examination (general) (routine) without abnormal findings: Secondary | ICD-10-CM

## 2023-12-20 NOTE — Other
PT aware of results

## 2023-12-22 ENCOUNTER — Inpatient Hospital Stay: Admit: 2023-12-22 | Discharge: 2023-12-22 | Payer: MEDICARE | Primary: Family

## 2023-12-22 DIAGNOSIS — M6289 Other specified disorders of muscle: Secondary | ICD-10-CM

## 2023-12-22 DIAGNOSIS — N3941 Urge incontinence: Secondary | ICD-10-CM

## 2023-12-26 NOTE — Other
 [facility] [place] [id number] Number: [phone] Number: [phone] signature indicates that you have reviewed and agree with the established Plan of Treatment as documented below. This order renewal is required for therapy to continue on an outpatient basis. Please co-sign this document electronically or return via the fax number listed on the document.  Your prompt response is required and appreciated. Additional Physician Comments:   ___________________________________       __________________________________Physician Signature                                             Date___________________________________Physician Printed NamePhysical Therapy Pelvic EvaluationPatient Name:  Sarah Montes Record Number:  [id number] Date of Birth:  September 21, 1932Therapist:  Lionel December, DPTReferring Provider:  Heloise Beecham, MDICD-10 Diagnosis(es):Problem List         ICD-10-CM   Right Leg and Left Knee  Ulcer of right lower leg, with unspecified severity [facility] Code) L97.919   PT-UUI-12/22/23  Pelvic floor dysfunction M62.89  Urgency incontinence N39.41 General InformationTherapy Episode of Care   Date of Visit:  12/22/2023    Treatment Number:  1    Date the Treatment Plan was Initiated/Reviewed:  12/22/2023   Start of Care Date:  12/22/2023    Onset of Illness/Injury Date:  11/16/2023    Progress Report Due Date:  01/22/2024 Precautions/Limitations   Precautions/Limitations:  No known precautions/limitationsNew Neurological Deficit   Did the patient have a new neurological deficit as evidenced by diminished muscle strength   of less than 3 at any time during the 90 days after spine surgery:  N/AInterpreter [employer] Utilized?  NoCognition / Learning Assessment   Primary Learner Relationship:  Patient        Barriers to learning:  Visual and Hearing        Preferred language:  English        Preferred learning style:  DemonstrationI reviewed the Patient Care Agreement and Attendance Form with the Patient/Family.  The Patient/Family verbalized understanding.Rehabilitation GroupingsPrimary Group:  Pelvic Health Secondary Group:  Non-surgical - WomenTertiary Group: IncontinenceMedication Review:Current Outpatient Medications Medication Sig  brimonidine Place 1 drop into both eyes 2 (two) times daily.  calcium carbonate Take 1 tablet (600 mg total) by mouth daily.  cholecalciferol (vitamin D3) Take 1 tablet (1,000 Units total) by mouth daily.  dorzolamide-timoloL Place 1 drop into the left eye 2 (two) times daily.  ketoconazole daily as needed.  Vyzulta Place 1 drop into both eyes nightly.  vit C-E-cupric-zinc-lutein Take 1 capsule by mouth 2 (two) times daily. SubjectiveChief Complaint:Urinary Urgency IncontinencePertinent History of Current Problem:  The patient presents with chronic bladder issues for evaluation and management. She was referred by Dr. Laural Benes for evaluation of bladder issues.She has experienced chronic bladder issues for many years, which began after childbirth. She has two children in their fifties and had normal pregnancies but difficult labor and delivery, including episiotomies and the use of low forceps. Previous treatments include bladder sling surgery and a procedure involving electrodes on her ankle (PTNS) to affect bladder nerves, neither of which significantly helped. She has not been able to perform Kegel exercises effectively and has not tried physical therapy for them.She experiences nocturnal enuresis, requiring the use of a pad at night as she does not wake up in time to reach the bathroom before urination begins. This typically occurs  once a night, sometimes twice. During the day, she uses a panty liner and changes it about three times, depending on fluid intake and proximity to a bathroom. She urinates about four times a day, despite limited fluid intake, which includes one cup of coffee and minimal water. She tries to drink three glasses of water a day but sometimes gets busy and drinks less.She describes urgency incontinence, where she sometimes does not make it to the bathroom in time, especially if she is distracted or waits too long. She does not experience stress incontinence with physical activities like standing, sitting, bending, or reaching. She has no bladder pain and reports regular bowel movements, although she manages constipation with iron supplements and has stopped prune juice due to adverse effects.Her past surgical history includes a urethrotomy in the 1980s for frequent bladder infections, which have since resolved. She uses estradiol cream twice a week on the urethra, as prescribed by Dr. Laural Benes, to help with urinary issues. She also has a history of hysterectomy at age 89, with no menopausal symptoms noted.12/15/2023 US PELVIS COMPLETE:No free fluid seen. Postoperative changes suspected in the ovaries are not visualized. The study is limited on this transabdominal only exam and could be further assessed on MRI if thought clinically indicated. Past Medical HistoryPast Medical History: Diagnosis Date  Abnormal cervical Papanicolaou smear   Glaucoma   Macular degeneration disease  Past Surgical HistoryPast Surgical History: Procedure Laterality Date  BLADDER SUSPENSION    CATARACT EXTRACTION    FINGER SURGERY Left   cyst  HYSTERECTOMY   AllergiesAdhesive, Alendronate, Alendronic acid, Atorvastatin, Penicillins, Phenylalanine, Tramadol, and OxycodonePain Rating:  0 / 10Educational/Vocational History:  She works as a Contractor virtually Social/Sport/Leisure History:   She maintains an active lifestyle, including going to the gym for chair yoga, cycling, and weightlifting. She experienced a fall from a treadmill last year, resulting in stitches, and now avoids treadmills.Prior Level of Function:  chronic   Change in status from prior level of function?  Chronic incontinenceObjectiveInitial Outcomes Measure completedPelvic HealthHistory   OB/GYN/Pelvic Surgeries:  Partial hysterectomy and POP repairs (bladder sling)   Obstetric History      Gravida:  3      Para:  2      Miscarriages:  1      Episiotomy:  Yes   Infection history: Lower UTI   Endocrine history: Postmenopausal (add date to comments)  Unsure due to hysterectomyTests/Measures   Sexual Function Assessment      Sexual function:  Not active (husband has passed away)Bladder Function   Bladder Habits      Fluid intake, amounts and types:  Coffee, 6oz water (tries for 3 glasses), seltzer at [employer] usage:  Yes      Correct continence pads:  Yes      Number of pads used per day:  Eval : 3 liners + 1 pad   Bladder Function      Bladder function:  Impaired      Void frequency (per day): 5-6      Nocturia:  Yes         Number of times:  1-2      Bladder urge:  Present      Initiation:  Present      Sense of fully emptying:  Absent      Post void dribble:  Absent      Sense of urgency:  Present      Straining:  Absent      UUI: Yes      Amount:      Frequency: almost daily      SUI: No      ZO:XWRUE Function   Bowel function:  Within normal limits   Straining: Absent      Constipation: Yes   Fecal incontinence: NoPhysical Exam   Posture:  Within  normal limits   Flexibility:     Not assessedPelvic Floor Evaluation   Pelvic exam explained to patient:  Patient defers at this time, option to consent in futureOutcome Measures   Pelvic Floor Distress Inventory (PFDI 20)      Scoring: 0=Not present (never experienced)  1=Not at all (experienced previously)  2=Somewhat   3=Moderately   4=Quite a bit   Pelvic Organ Prolapse Distress Inventory 6 (POPDI-6)      1. Usually experience pressure in lower abdomen?  0       2. Usually experience heaviness/dullness in pelvic area? 0       3. Usually have bulge/something falling out you can see/feel in vaginal area? 0       4. Ever have to push on vagina/around rectum to have complete bowel movement?  0      5. Usually experience feeling of incomplete bladder emptying?  2      6. Ever have to push on bulge in vaginal area with fingers to start/complete urination?  0      TOTAL Score:  2   Colorectal-Anal Distress inventory 8 (CRAD-8)      7. Feel need to strain too hard to have bowel movement?  0      8. Feel you have not completely emptied your bowels at end of bowel movement?  0      9. Usually lose stool beyond your control if your stool is well formed?  0      10. Usually lose stool beyond your control if stool is loose?  0      11. Usually lose gas from the rectum beyond your control?  0      12. Usually have pain when you pass stool?  0      13. Experience strong sense of urgency/rush to bathroom to have bowel movement?  0      14. Does part of bowel ever pass through rectum/bulge outside during/after bowel movement?  0      TOTAL Score:  0   Urinary Distress Inventory 6 (UDI-6)      15. Usually experience frequent urination?  2      16. Usually experience urine leakage associated with feeling of urgency?  3      17. Usually experience urine leakage related to coughing, sneezing, laughing?  0      18. Usually experience small amounts of urine leakage?  3      19. Usually experience difficulty emptying bladder?  2      20. Usually experience pain/discomfort in lower abdomen/genital region?  0      TOTAL Score:  10Overall Comments   PFDI-20: 12 ptsUDI-6: 10 pts                                     Treatment Provided This VisitCPT Code Interventions Timed Minutes Untimed Minutes Total Minutes Physical Therapy Evaluation - Moderate Complexity (97162)   30 30 Therapeutic Activity (97530) Pt education in diagnosis, prognosis  and POC.Initial HEP includes:Issued a 2-day bladder log Pt education in full voiding techniques- hand out providedReviewed evening strategies including moving bladder irritants (seltzer) to earlier in the day.  No PO 3 hours before bedtime (incl ice cream). Double void in the last hour before sleep 25  25 N/A     N/A       Total Treatment Time: 55 Problem ListUrge incontinencePelvic floor dysfunctionAssessmentBarbara S Ciano presents with complaint of urge incontinence. Jaycee has experienced urge incontinence for many years, likely exacerbated by childbirth, episiotomies, and a urethrotomy. She reports nocturnal and daytime urinary leakage, requiring pads at night and panty liners during the day. Previous treatments, including a bladder sling and neuromodulation therapy, were ineffective. She has difficulty performing Kegel exercises and has not undergone physical therapy. She prefers non-pharmacological treatments and is hesitant about surgical options due to potential side effects. The primary goal is to improve her urinary control. These impairments are consistent with a pelvic floor dysfunction.  They will benefit from skilled physical therapy to address these impairments and optimize their functional activities.  A pelvic floor muscle assessment was explained in detail to [facility] today and will be performed next session to obtain greater objective data and assist in differential diagnosis. Physical Therapy may include therapeutic exercises, neuromuscular re-education, therapeutic activities, manual therapy, and biofeedback as needed.  Patient / Family / Caregiver EducationDiscussed role of therapyDiscussed the value of collaboration with other providersDiscussed the presenting problemReviewed the assessmentDiscussed plan of care and rationalePatient/Family/Caregiver demonstrate agreement with the planWritten materials / instruction providedRehab PotentialGoodPlanTherapeutic Exercise (97110)Manual Therapy (97140)Therapeutic Activity (97530)Self-Care/Home Management (97535)Neuromuscular Re-Education (97112)Biofeedback Training (90911)Electrical Stimulation - unattended (97014)Electrical Stimulation - attended (97032)Frequency:  1x per weekDuration:  12 weeksPlan for Next VisitBladder log reviewPF assessment with consentInitial PF HEPRecommendationsnoneGoalsShort Term Goals: (will be met in 5-6 visits)1. Patient will be Independent with the initial HEP to aid in increasing and maintaining gains made between PT sessions.2. PERF Testing (pt with right of refusal)3 Pt will report a decrease in night voiding to </= 1 time per night for improved rest. 4. Pt will report ability to delay voiding for at least 5 mins in order to get from bed to bathroom.5 Patient will increase water intake to a total goal of 32-64  ounces per day for overall wellness and decrease bladder irritation. 6. Patient is able to demonstrate pelvic floor contraction with proper breathing pattern in order to successfully suppress urge to go .Long Term Goals: ( will be met in 10-12 visits )1. Pt will report less than 8 point disability on the UDI-6 for improved subjective ADLs.2. Pt will increase her PF muscle endurance to at least 15 secs to increase ability to hold flow 3. Patient will report increased voiding intervals daytime to at least 2 hours or more evidenced by patient report and/or bladder diary4. Pt will be able to walk 50 % of the time without leakage 5. Pt will increase MMT of pelvic floor strength by at least  1-2 grades from initial assessment  I provided a concise overview of the ambient note generation solution. Rufina Falco or their legally authorized representative verbally consented to a temporary audio recording of their visit to assist with completing the visit documentation using an AI-powered solution. This note was reviewed for accuracy by Lionel December, PT who performed the clinical service.

## 2024-01-02 ENCOUNTER — Inpatient Hospital Stay: Admit: 2024-01-02 | Discharge: 2024-01-02 | Payer: MEDICARE | Primary: Family

## 2024-01-02 DIAGNOSIS — N3941 Urge incontinence: Secondary | ICD-10-CM

## 2024-01-02 DIAGNOSIS — M6289 Other specified disorders of muscle: Secondary | ICD-10-CM

## 2024-01-02 NOTE — Other
 [facility] REHABILITATION [facility] [place] [address]QIONG Number: [phone] Number: [phone] Therapy Pelvic Daily /  Progress NotePatient Name:  Sarah Montes Record Number:  [id number] Date of Birth:  [birth year]Therapist:  Lionel December, DPTReferring Provider:  Heloise Beecham, MDICD-10 Diagnosis(es):Problem List         ICD-10-CM   Right Leg and Left Knee  Ulcer of right lower leg, with unspecified severity [facility] Code) L97.919   PT-UUI-12/22/23  Pelvic floor dysfunction M62.89  Urgency incontinence N39.41 General InformationTherapy Episode of Care   Date of Visit:  01/02/2024    Treatment Number:  2    Date the Treatment Plan was Initiated/Reviewed:  12/22/2023   Start of Care Date:  12/22/2023    Onset of Illness/Injury Date:  11/16/2023    Progress Report Due Date:  01/22/2024 Precautions/Limitations   Precautions/Limitations:  No known precautions/limitationsNew Neurological Deficit   Did the patient have a new neurological deficit as evidenced by diminished muscle strength   of less than 3 at any time during the 90 days after spine surgery:  N/AInterpreter [employer] Utilized?  NoCognition / Learning Assessment   Primary Learner Relationship:  Patient        Barriers to learning:  Visual and Hearing        Preferred language:  English        Preferred learning style:  DemonstrationI reviewed the Patient Care Agreement and Attendance Form with the Patient/Family.  The Patient/Family verbalized understanding.Rehabilitation GroupingsPrimary Group:  Pelvic Health Secondary Group:  Non-surgical - WomenTertiary Group: IncontinenceMedication Review:Current Outpatient Medications Medication Sig  brimonidine Place 1 drop into both eyes 2 (two) times daily.  calcium carbonate Take 1 tablet (600 mg total) by mouth daily.  cholecalciferol (vitamin D3) Take 1 tablet (1,000 Units total) by mouth daily.  dorzolamide-timoloL Place 1 drop into the left eye 2 (two) times daily.  ketoconazole daily as needed.  Vyzulta Place 1 drop into both eyes nightly.  vit C-E-cupric-zinc-lutein Take 1 capsule by mouth 2 (two) times daily. SubjectiveHistory of Present Sarah Montes is a 89 year old female who presents with urinary incontinence and frequent urination.She experiences urinary incontinence characterized by frequent urination and leakage, often without an urge to urinate. She wakes up wet in the morning and wears a pad at night due to leakage. Medium leakage occurs upon waking, with additional leaks throughout the day, including a small leak four hours after breakfast and another while walking in the evening. On sedentary days, she reports large leakage while sleeping and subsequent urination with an urge after breakfast.She experiences nocturnal enuresis, with significant leakage occurring during sleep. She wakes up wet and sometimes wakes up to urinate without an urge. She has tried avoiding fluids before bed and double voiding before sleep but continues to experience nighttime leakage. She occasionally wakes up during the night but finds it difficult to return to sleep.She occasionally feels the urge to urinate, which she finds significant as it allows her to attempt to reach the bathroom in time. However, she often cannot make it to the bathroom when she feels the urge. On sedentary days, she did not record any leakage during the day when she felt the urge to urinate, but she usually cannot reach the bathroom in time when she feels the urge.She mentions a habit of urinating 'just in case' before leaving the house, which she did not do during the recorded days to observe her natural pattern. She acknowledges that this  habit may have affected her ability to feel the urge to urinate.Chief Complaint:Urinary Urgency IncontinencePertinent History of Current Problem:  The patient presents with chronic bladder issues for evaluation and management. She was referred by Dr. Laural Benes for evaluation of bladder issues.She has experienced chronic bladder issues for many years, which began after childbirth. She has two children in their fifties and had normal pregnancies but difficult labor and delivery, including episiotomies and the use of low forceps. Previous treatments include bladder sling surgery and a procedure involving electrodes on her ankle (PTNS) to affect bladder nerves, neither of which significantly helped. She has not been able to perform Kegel exercises effectively and has not tried physical therapy for them.She experiences nocturnal enuresis, requiring the use of a pad at night as she does not wake up in time to reach the bathroom before urination begins. This typically occurs once a night, sometimes twice. During the day, she uses a panty liner and changes it about three times, depending on fluid intake and proximity to a bathroom. She urinates about four times a day, despite limited fluid intake, which includes one cup of coffee and minimal water. She tries to drink three glasses of water a day but sometimes gets busy and drinks less.She describes urgency incontinence, where she sometimes does not make it to the bathroom in time, especially if she is distracted or waits too long. She does not experience stress incontinence with physical activities like standing, sitting, bending, or reaching. She has no bladder pain and reports regular bowel movements, although she manages constipation with iron supplements and has stopped prune juice due to adverse effects.Her past surgical history includes a urethrotomy in the 1980s for frequent bladder infections, which have since resolved. She uses estradiol cream twice a week on the urethra, as prescribed by Dr. Laural Benes, to help with urinary issues. She also has a history of hysterectomy at age 66, with no menopausal symptoms noted.12/15/2023 US PELVIS COMPLETE:No free fluid seen. Postoperative changes suspected in the ovaries are not visualized. The study is limited on this transabdominal only exam and could be further assessed on MRI if thought clinically indicated. Past Medical HistoryPast Medical History: Diagnosis Date  Abnormal cervical Papanicolaou smear   Glaucoma   Macular degeneration disease  Past Surgical HistoryPast Surgical History: Procedure Laterality Date  BLADDER SUSPENSION    CATARACT EXTRACTION    FINGER SURGERY Left   cyst  HYSTERECTOMY   AllergiesAdhesive, Alendronate, Alendronic acid, Atorvastatin, Penicillins, Phenylalanine, Tramadol, and OxycodonePain Rating:  0 / 10Educational/Vocational History:  She works as a Contractor virtually Social/Sport/Leisure History:   She maintains an active lifestyle, including going to the gym for chair yoga, cycling, and weightlifting. She experienced a fall from a treadmill last year, resulting in stitches, and now avoids treadmills.Prior Level of Function:  chronic   Change in status from prior level of function?  Chronic incontinenceObjectiveOutcomes Measure not required for this visitPelvic HealthHistory   OB/GYN/Pelvic Surgeries:  Partial hysterectomy and POP repairs (bladder sling)   Obstetric History      Gravida:  3      Para:  2      Miscarriages:  1      Episiotomy:  Yes   Infection history: Lower UTI   Endocrine history: Postmenopausal (add date to comments)  Unsure due to hysterectomyTests/Measures   Sexual Function Assessment      Sexual function:  Not active (husband has passed away)Bladder Function   Bladder Habits      Fluid  intake, amounts and types:  Coffee, 6oz water (tries for 3 glasses), seltzer at [employer] usage:  Yes      Correct continence pads:  Yes      Number of pads used per day:  Eval : 3 liners + 1 pad   Bladder Function      Bladder function:  Impaired      Void frequency (per day): 5-6      Nocturia:  Yes         Number of times:  1-2      Bladder urge:  Present      Initiation:  Present      Sense of fully emptying:  Absent      Post void dribble:  Absent      Sense of urgency:  Present      Straining:  Absent      UUI: Yes      Amount:      Frequency: almost daily      SUI: No      ZO:XWRUE Function   Bowel function:  Within normal limits   Straining: Absent      Constipation: Yes   Fecal incontinence: NoPhysical Exam   Posture:  Within  normal limits   Flexibility:     Not assessedPelvic Floor Evaluation   Pelvic exam explained to patient:  Patient defers at this time, option to consent in futureOutcome Measures   Pelvic Floor Distress Inventory (PFDI 20)      Scoring: 0=Not present (never experienced)  1=Not at all (experienced previously)  2=Somewhat   3=Moderately   4=Quite a bit   Pelvic Organ Prolapse Distress Inventory 6 (POPDI-6)      1. Usually experience pressure in lower abdomen?  0       2. Usually experience heaviness/dullness in pelvic area? 0       3. Usually have bulge/something falling out you can see/feel in vaginal area? 0       4. Ever have to push on vagina/around rectum to have complete bowel movement?  0      5. Usually experience feeling of incomplete bladder emptying?  2      6. Ever have to push on bulge in vaginal area with fingers to start/complete urination?  0      TOTAL Score:  2   Colorectal-Anal Distress inventory 8 (CRAD-8)      7. Feel need to strain too hard to have bowel movement?  0      8. Feel you have not completely emptied your bowels at end of bowel movement?  0      9. Usually lose stool beyond your control if your stool is well formed?  0      10. Usually lose stool beyond your control if stool is loose?  0      11. Usually lose gas from the rectum beyond your control?  0      12. Usually have pain when you pass stool?  0      13. Experience strong sense of urgency/rush to bathroom to have bowel movement?  0      14. Does part of bowel ever pass through rectum/bulge outside during/after bowel movement?  0      TOTAL Score:  0   Urinary Distress Inventory 6 (UDI-6)      15. Usually experience frequent urination?  2      16. Usually experience urine  leakage associated with feeling of urgency?  3      17. Usually experience urine leakage related to coughing, sneezing, laughing?  0      18. Usually experience small amounts of urine leakage?  3      19. Usually experience difficulty emptying bladder?  2      20. Usually experience pain/discomfort in lower abdomen/genital region?  0      TOTAL Score:  10Overall Comments   PFDI-20: 12 ptsUDI-6: 10 pts                                     Treatment Provided This VisitCPT Code Interventions Timed Minutes Untimed Minutes Total Minutes Neuromuscular Re-Education [id number]) Patient education in anatomy and the micturition cycle>Impact of JIC voids on micturition > goal to stop JIC voidsVoiding schedule of every >implementation techniques 20  20 Therapeutic Activity (97530) Reviewed her 2-day bladder log to identify patterns and increase self-awareness of habitsPt education in full voiding techniques- hand out providedReviewed evening strategies including moving bladder irritants (seltzer) to earlier in the day.  No PO 3 hours before bedtime (incl ice cream). Double void in the last hour before sleepDiscussed potentially setting an alarm to wake up before leakage events 30  30 N/A     N/A       Total Treatment Time: 50 Problem ListUrge incontinencePelvic floor dysfunctionAssessmentBarbara S Montes presents with complaint of urge incontinence. Soila has experienced urge incontinence for many years, likely exacerbated by childbirth, episiotomies, and a urethrotomy. She reports nocturnal and daytime urinary leakage, requiring pads at night and panty liners during the day. Urinary IncontinenceExperiences frequent urinary incontinence, characterized by leakage without an urge to urinate, particularly during the night and upon waking. Incontinence occurs both when active and sedentary, with a pattern of leakage without urge and occasional urge without leakage. Developed a habit of 'just in case' urination, disrupting the natural urge to urinate. Goal is to retrain the brain and bladder to communicate effectively and establish a regular voiding schedule to reduce incontinence. Emphasized the importance of sitting fully on the toilet to ensure complete bladder emptying and using relaxation techniques to facilitate this process.- Implement a regular voiding schedule every 60 to 90 minutes during the day.- Avoid 'just in case' urination except in situations where bathroom access is limited.- Sit fully on the toilet to ensure complete bladder emptying, using a stool to elevate feet if necessary.- Practice relaxation techniques while on the toilet to facilitate complete bladder emptying.- Consider using an external alarm or timer to remind of scheduled bathroom visits.- Avoid fluids and urinate twice in the hour before bedtime to minimize nighttime incontinence.- Reassess in one week to evaluate progress and adjust the voiding schedule as needed.NocturiaExperiences nocturnal urinary incontinence, waking up wet without the urge to urinate. Does not wake up to urinate during the night, indicating a lack of communication between the brain and bladder. Focus is on improving daytime bladder control to potentially reduce nighttime incontinence. Discussed the option of setting an alarm to wake up and urinate during the night if daytime improvements do not reduce nighttime incontinence, though this is not preferred due to difficulty returning to sleep.- Monitor for any changes in nighttime incontinence as daytime control improves.- Consider setting an alarm to wake up and urinate during the night if daytime improvements do not reduce nighttime incontinence, though this is not preferred due to difficulty  returning to sleep.Pelvic Floor DysfunctionMay have pelvic floor dysfunction contributing to incomplete bladder emptying and incontinence. Has not been previously instructed on proper pelvic floor muscle exercises. Plan to perform a pelvic floor muscle assessment at the next visit and develop a personalized pelvic floor strengthening program after assessment.- Perform a pelvic floor muscle assessment at the next visit.- Develop a personalized pelvic floor strengthening program after assessment.Patient / Family / Caregiver EducationDiscussed role of therapyDiscussed the value of collaboration with other providersDiscussed the presenting problemReviewed the assessmentDiscussed plan of care and rationalePatient/Family/Caregiver demonstrate agreement with the planWritten materials / instruction providedRehab PotentialGoodPlanTherapeutic Exercise (97110)Manual Therapy (97140)Therapeutic Activity (97530)Self-Care/Home Management (97535)Neuromuscular Re-Education (97112)Biofeedback Training (90911)Electrical Stimulation - unattended (97014)Electrical Stimulation - attended (97032)Frequency:  1x per weekDuration:  12 weeksPlan for Next VisitBladder log reviewPF assessment with consentInitial PF HEPRecommendationsnoneGoalsShort Term Goals: (will be met in 5-6 visits)1. Patient will be Independent with the initial HEP to aid in increasing and maintaining gains made between PT sessions.2. PERF Testing (pt with right of refusal)3 Pt will report a decrease in night voiding to </= 1 time per night for improved rest. 4. Pt will report ability to delay voiding for at least 5 mins in order to get from bed to bathroom.5 Patient will increase water intake to a total goal of 32-64  ounces per day for overall wellness and decrease bladder irritation. 6. Patient is able to demonstrate pelvic floor contraction with proper breathing pattern in order to successfully suppress urge to go .Long Term Goals: ( will be met in 10-12 visits )1. Pt will report less than 8 point disability on the UDI-6 for improved subjective ADLs.2. Pt will increase her PF muscle endurance to at least 15 secs to increase ability to hold flow 3. Patient will report increased voiding intervals daytime to at least 2 hours or more evidenced by patient report and/or bladder diary4. Pt will be able to walk 50 % of the time without leakage 5. Pt will increase MMT of pelvic floor strength by at least  1-2 grades from initial assessment  I provided a concise overview of the ambient note generation solution. Rufina Falco or their legally authorized representative verbally consented to a temporary audio recording of their visit to assist with completing the visit documentation using an AI-powered solution. This note was reviewed for accuracy by Lionel December, PT who performed the clinical service.

## 2024-01-09 ENCOUNTER — Inpatient Hospital Stay: Admit: 2024-01-09 | Discharge: 2024-01-09 | Payer: MEDICARE | Primary: Family

## 2024-01-09 DIAGNOSIS — N3941 Urge incontinence: Secondary | ICD-10-CM

## 2024-01-09 NOTE — Progress Notes
 Program_ID:[id number] Access Code: [email or web address]: [email or web address]: 03-25-2025Prepared By: Lina Sayre NotesExercises    - Seated Gluteal Sets - 2 x daily -  x weekly -  sets - 5-10 reps - 2-3 breath hold    - Seated Hip Adduction Isometrics with Ball - 2 x daily -  x weekly -  sets - 5-10 reps - 2-3 breath hold    - Seated Hip Abduction with Resistance - 2 x daily -  x weekly -  sets - 5-10 reps - 2-3 breath hold    - Diaphragmatic Breathing to Reduce Intra-abdominal Pressure: Sit to Stand -  x daily -  x weekly -  sets - 3-5 reps

## 2024-01-09 NOTE — Other
 [facility] REHABILITATION [facility] [place] [id number] Number: [id number] Number: [phone] Therapy Pelvic Daily /  Progress NotePatient Name:  Sarah Montes Record Number:  [id number] Date of Birth:  12-09-1932Therapist:  Lionel December, DPTReferring Provider:  Heloise Beecham, MDICD-10 Diagnosis(es):Problem List         ICD-10-CM   Right Leg and Left Knee  Ulcer of right lower leg, with unspecified severity [facility] Code) L97.919   PT-UUI-12/22/23  Pelvic floor dysfunction M62.89  Urgency incontinence N39.41 General InformationTherapy Episode of Care   Date of Visit:  01/09/2024    Treatment Number:  3    Date the Treatment Plan was Initiated/Reviewed:  12/22/2023   Start of Care Date:  12/22/2023    Onset of Illness/Injury Date:  11/16/2023    Progress Report Due Date:  01/22/2024 Precautions/Limitations   Precautions/Limitations:  No known precautions/limitationsNew Neurological Deficit   Did the patient have a new neurological deficit as evidenced by diminished muscle strength   of less than 3 at any time during the 90 days after spine surgery:  N/AInterpreter [employer] Utilized?  NoCognition / Learning Assessment   Primary Learner Relationship:  Patient        Barriers to learning:  Visual and Hearing        Preferred language:  English        Preferred learning style:  DemonstrationI reviewed the Patient Care Agreement and Attendance Form with the Patient/Family.  The Patient/Family verbalized understanding.Rehabilitation GroupingsPrimary Group:  Pelvic Health Secondary Group:  Non-surgical - WomenTertiary Group: IncontinenceMedication Review:Current Outpatient Medications Medication Sig  brimonidine Place 1 drop into both eyes 2 (two) times daily.  calcium carbonate Take 1 tablet (600 mg total) by mouth daily.  cholecalciferol (vitamin D3) Take 1 tablet (1,000 Units total) by mouth daily.  dorzolamide-timoloL Place 1 drop into the left eye 2 (two) times daily.  ketoconazole daily as needed.  Vyzulta Place 1 drop into both eyes nightly.  vit C-E-cupric-zinc-lutein Take 1 capsule by mouth 2 (two) times daily. SubjectiveHistory of Present Sarah Montes is a 89 year old female who presents with urinary incontinence and bladder training issues.She is experiencing urinary incontinence and is undergoing bladder training. Initially, she attempted to wait every hour and a half before urinating, which resulted in only one or two leaks, indicating significant improvement. However, her daughter-in-law set her watch to buzz every hour, disrupting her schedule, especially when she was busy. She finds it challenging to adhere to the hourly schedule due to her activities and prefers the hour and a half interval. When following the hour and a half schedule, she only urinates a small amount, suggesting her bladder is not fully stretched. She is working on increasing the interval between voids to allow her bladder to stretch more and reduce the frequency of urination, aiming to reach a two-hour interval between voids.At night, she experiences nocturnal enuresis, waking up wet around 1:40 AM. She changed her pad and struggled to return to sleep. She is concerned about setting an alarm at night due to anxiety about not being able to fall back asleep.She is socially active, [facility] [employer] and participating in a writing group. She notes that stress during these activities can exacerbate her symptoms, particularly during her writing group sessions.Chief Complaint:Urinary Urgency IncontinencePertinent History of Current Problem:  The patient presents with chronic bladder issues for evaluation and management. She was referred by Dr. Laural Benes for evaluation of bladder issues.She has experienced  chronic bladder issues for many years, which began after childbirth. She has two children in their fifties and had normal pregnancies but difficult labor and delivery, including episiotomies and the use of low forceps. Previous treatments include bladder sling surgery and a procedure involving electrodes on her ankle (PTNS) to affect bladder nerves, neither of which significantly helped. She has not been able to perform Kegel exercises effectively and has not tried physical therapy for them.She experiences nocturnal enuresis, requiring the use of a pad at night as she does not wake up in time to reach the bathroom before urination begins. This typically occurs once a night, sometimes twice. During the day, she uses a panty liner and changes it about three times, depending on fluid intake and proximity to a bathroom. She urinates about four times a day, despite limited fluid intake, which includes one cup of coffee and minimal water. She tries to drink three glasses of water a day but sometimes gets busy and drinks less.She describes urgency incontinence, where she sometimes does not make it to the bathroom in time, especially if she is distracted or waits too long. She does not experience stress incontinence with physical activities like standing, sitting, bending, or reaching. She has no bladder pain and reports regular bowel movements, although she manages constipation with iron supplements and has stopped prune juice due to adverse effects.Her past surgical history includes a urethrotomy in the 1980s for frequent bladder infections, which have since resolved. She uses estradiol cream twice a week on the urethra, as prescribed by Dr. Laural Benes, to help with urinary issues. She also has a history of hysterectomy at age 89, with no menopausal symptoms noted.12/15/2023 US PELVIS COMPLETE:No free fluid seen. Postoperative changes suspected in the ovaries are not visualized. The study is limited on this transabdominal only exam and could be further assessed on MRI if thought clinically indicated. Past Medical HistoryPast Medical History: Diagnosis Date  Abnormal cervical Papanicolaou smear   Glaucoma   Macular degeneration disease  Past Surgical HistoryPast Surgical History: Procedure Laterality Date  BLADDER SUSPENSION    CATARACT EXTRACTION    FINGER SURGERY Left   cyst  HYSTERECTOMY   AllergiesAdhesive, Alendronate, Alendronic acid, Atorvastatin, Penicillins, Phenylalanine, Tramadol, and OxycodonePain Rating:  0 / 10Educational/Vocational History:  She works as a Contractor virtually Social/Sport/Leisure History:   She maintains an active lifestyle, including going to the gym for chair yoga, cycling, and weightlifting. She experienced a fall from a treadmill last year, resulting in stitches, and now avoids treadmills.Prior Level of Function:  chronic   Change in status from prior level of function?  Chronic incontinenceObjectiveOutcomes Measure not required for this visitPelvic HealthHistory   OB/GYN/Pelvic Surgeries:  Partial hysterectomy and POP repairs (bladder sling)   Obstetric History      Gravida:  3      Para:  2      Miscarriages:  1      Episiotomy:  Yes   Infection history: Lower UTI   Endocrine history: Postmenopausal (add date to comments)  Unsure due to hysterectomyTests/Measures   Sexual Function Assessment      Sexual function:  Not active (husband has passed away)Bladder Function   Bladder Habits      Fluid intake, amounts and types:  Coffee, 6oz water (tries for 3 glasses), seltzer at [employer] usage:  Yes      Correct continence pads:  Yes      Number of pads used per  day:  Eval : 3 liners + 1 pad   Bladder Function      Bladder function:  Impaired      Void frequency (per day): 5-6 Nocturia:  Yes         Number of times:  1-2      Bladder urge:  Present      Initiation:  Present      Sense of fully emptying:  Absent      Post void dribble:  Absent      Sense of urgency:  Present      Straining:  Absent      UUI: Yes      Amount:      Frequency: almost daily      SUI: No      YN:WGNFA Function   Bowel function:  Within normal limits   Straining: Absent      Constipation: Yes   Fecal incontinence: NoPhysical Exam   Posture:  Within  normal limits   Flexibility:     Not assessedPelvic Floor Evaluation   Pelvic exam explained to patient:  Patient defers at this time, option to consent in futureOutcome Measures   Pelvic Floor Distress Inventory (PFDI 20)      Scoring: 0=Not present (never experienced)  1=Not at all (experienced previously)  2=Somewhat   3=Moderately   4=Quite a bit   Pelvic Organ Prolapse Distress Inventory 6 (POPDI-6)      1. Usually experience pressure in lower abdomen?  0       2. Usually experience heaviness/dullness in pelvic area? 0       3. Usually have bulge/something falling out you can see/feel in vaginal area? 0       4. Ever have to push on vagina/around rectum to have complete bowel movement?  0      5. Usually experience feeling of incomplete bladder emptying?  2      6. Ever have to push on bulge in vaginal area with fingers to start/complete urination?  0      TOTAL Score:  2   Colorectal-Anal Distress inventory 8 (CRAD-8)      7. Feel need to strain too hard to have bowel movement?  0      8. Feel you have not completely emptied your bowels at end of bowel movement?  0      9. Usually lose stool beyond your control if your stool is well formed?  0      10. Usually lose stool beyond your control if stool is loose?  0      11. Usually lose gas from the rectum beyond your control?  0      12. Usually have pain when you pass stool?  0      13. Experience strong sense of urgency/rush to bathroom to have bowel movement?  0      14. Does part of bowel ever pass through rectum/bulge outside during/after bowel movement?  0      TOTAL Score:  0   Urinary Distress Inventory 6 (UDI-6)      15. Usually experience frequent urination?  2      16. Usually experience urine leakage associated with feeling of urgency?  3      17. Usually experience urine leakage related to coughing, sneezing, laughing?  0      18. Usually experience small amounts of urine leakage?  3      19. Usually experience difficulty  emptying bladder?  2      20. Usually experience pain/discomfort in lower abdomen/genital region?  0      TOTAL Score:  10Overall Comments   PFDI-20: 12 ptsUDI-6: 10 pts                                     Treatment Provided This VisitCPT Code Interventions Timed Minutes Untimed Minutes Total Minutes Neuromuscular Re-Education [phone]) Patient education in anatomy and the micturition cycle>Impact of JIC voids on micturition > goal to stop JIC voidsVoiding schedule of every 60-23mins >implementation techniquesIntroduction to pelvic floor muscle anatomy and exercises to strengthen her pelvic muscles. She acknowledges difficulty in performing Kegel exercises correctly and is learning to engage her pelvic floor muscles without holding her breath. Worked on awareness of co-contractors with breathing coordination today:Access Code: [email or web address]: [email or web address]: 03/25/2025Prepared by: Clarisse Gouge GamacheExercises- Seated Gluteal Sets  - 2 x daily - 5-10 reps - 2-3 breath hold- Seated Hip Adduction Isometrics with Ball  - 2 x daily - 5-10 reps - 2-3 breath hold- Seated Hip Abduction with Resistance  - 2 x daily - 5-10 reps - 2-3 breath hold- Diaphragmatic Breathing to Reduce Intra-abdominal Pressure: Sit to Stand  - 3-5 reps 54  54 Therapeutic Activity (97530) Reviewed her 2-day bladder log to identify patterns and increase self-awareness of habitsPt education in full voiding techniques- hand out providedReviewed evening strategies including moving bladder irritants (seltzer) to earlier in the day.  No PO 3 hours before bedtime (incl ice cream). Double void in the last hour before sleepDiscussed potentially setting an alarm to wake up before leakage events    N/A     N/A       Total Treatment Time: 54 Problem ListUrge incontinencePelvic floor dysfunctionAssessmentBarbara S Panning presents with complaint of urge incontinence. Danialle has experienced urge incontinence for many years, likely exacerbated by childbirth, episiotomies, and a urethrotomy. She reports nocturnal and daytime urinary leakage, requiring pads at night and panty liners during the day. Assessment and PlanUrinary IncontinenceUrinary incontinence characterized by urgency and occasional leakage, likely due to miscommunication between the brain and bladder, leading to an overactive bladder response. Bladder training by voiding every 90 minutes has improved symptoms with only one or two leaks. The goal is to retrain the bladder to hold urine for longer periods by gradually increasing the voiding interval. Nocturnal incontinence is expected to improve as daytime control is established.- Reset watch to buzz every 90 minutes for bladder training.- Instruct to void every 90 minutes during the day and increase the interval by 15 minutes once dry for two consecutive days.- Focus on daytime bladder training to improve nighttime incontinence without disrupting sleep.- Educate on the importance of consistency in bladder training to retrain brain and bladder communication.Pelvic Floor Muscle WeaknessPelvic floor muscle weakness contributing to urinary incontinence. Emphasized the importance of proper technique and breathing during exercises to strengthen pelvic floor muscles and improve sphincter function. Consistent pelvic floor muscle exercises are effective when performed correctly.- Educate on pelvic floor anatomy and the importance of strengthening these muscles.- Instruct on performing gluteal squeezes while breathing, aiming for 5-10 repetitions twice daily.- Instruct on performing ball or pillow squeezes between the knees while breathing, aiming for 5-10 repetitions twice daily.- Instruct on performing band exercises to strengthen the hip muscles, aiming for 8-10 repetitions twice daily.- Advise to practice breathing out while standing up to reduce pressure on the bladder.Patient /  Family / Caregiver EducationDiscussed role of therapyDiscussed the value of collaboration with other providersDiscussed the presenting problemReviewed the assessmentDiscussed plan of care and rationalePatient/Family/Caregiver demonstrate agreement with the planWritten materials / instruction providedRehab PotentialGoodPlanTherapeutic Exercise (97110)Manual Therapy (97140)Therapeutic Activity (97530)Self-Care/Home Management (97535)Neuromuscular Re-Education (97112)Biofeedback Training (90911)Electrical Stimulation - unattended (97014)Electrical Stimulation - attended (97032)Frequency:  1x per weekDuration:  12 weeksPlan for Next VisitBladder log reviewPF assessment with consentInitial PF HEPRecommendationsnoneGoalsShort Term Goals: (will be met in 5-6 visits)1. Patient will be Independent with the initial HEP to aid in increasing and maintaining gains made between PT sessions.2. PERF Testing (pt with right of refusal)3 Pt will report a decrease in night voiding to </= 1 time per night for improved rest. 4. Pt will report ability to delay voiding for at least 5 mins in order to get from bed to bathroom.5 Patient will increase water intake to a total goal of 32-64  ounces per day for overall wellness and decrease bladder irritation. 6. Patient is able to demonstrate pelvic floor contraction with proper breathing pattern in order to successfully suppress urge to go .Long Term Goals: ( will be met in 10-12 visits )1. Pt will report less than 8 point disability on the UDI-6 for improved subjective ADLs.2. Pt will increase her PF muscle endurance to at least 15 secs to increase ability to hold flow 3. Patient will report increased voiding intervals daytime to at least 2 hours or more evidenced by patient report and/or bladder diary4. Pt will be able to walk 50 % of the time without leakage 5. Pt will increase MMT of pelvic floor strength by at least  1-2 grades from initial assessment  I provided a concise overview of the ambient note generation solution. Rufina Falco or their legally authorized representative verbally consented to a temporary audio recording of their visit to assist with completing the visit documentation using an AI-powered solution. This note was reviewed for accuracy by Lionel December, PT who performed the clinical service.

## 2024-01-15 DIAGNOSIS — M6289 Other specified disorders of muscle: Secondary | ICD-10-CM

## 2024-01-17 ENCOUNTER — Inpatient Hospital Stay: Admit: 2024-01-17 | Discharge: 2024-01-17 | Payer: MEDICARE | Primary: Family

## 2024-01-17 DIAGNOSIS — M6289 Other specified disorders of muscle: Secondary | ICD-10-CM

## 2024-01-17 DIAGNOSIS — N3941 Urge incontinence: Secondary | ICD-10-CM

## 2024-01-17 NOTE — Other
 [facility] REHABILITATION [facility] [place] [facility] [place] [address]WGNFA Number: [phone] Number: [phone] Therapy Pelvic Daily /  Progress NotePatient Name:  Sarah Montes Record Number:  [id number] Date of Birth:  03-23-1932Therapist:  Lionel December, DPTReferring Provider:  Heloise Beecham, MDICD-10 Diagnosis(es):Problem List         ICD-10-CM   Right Leg and Left Knee  Ulcer of right lower leg, with unspecified severity [facility] Code) L97.919   PT-UUI-12/22/23  Pelvic floor dysfunction M62.89  Urgency incontinence N39.41 General InformationTherapy Episode of Care   Date of Visit:  01/17/2024    Treatment Number:  4    Date the Treatment Plan was Initiated/Reviewed:  12/22/2023   Start of Care Date:  12/22/2023    Onset of Illness/Injury Date:  11/16/2023    Progress Report Due Date:  01/22/2024 Precautions/Limitations   Precautions/Limitations:  No known precautions/limitationsNew Neurological Deficit   Did the patient have a new neurological deficit as evidenced by diminished muscle strength   of less than 3 at any time during the 90 days after spine surgery:  N/AInterpreter [employer] Utilized?  NoCognition / Learning Assessment   Primary Learner Relationship:  Patient        Barriers to learning:  Visual and Hearing        Preferred language:  English        Preferred learning style:  DemonstrationI reviewed the Patient Care Agreement and Attendance Form with the Patient/Family.  The Patient/Family verbalized understanding.Rehabilitation GroupingsPrimary Group:  Pelvic Health Secondary Group:  Non-surgical - WomenTertiary Group: IncontinenceMedication Review:Current Outpatient Medications Medication Sig  brimonidine Place 1 drop into both eyes 2 (two) times daily.  calcium carbonate Take 1 tablet (600 mg total) by mouth daily.  cholecalciferol (vitamin D3) Take 1 tablet (1,000 Units total) by mouth daily.  dorzolamide-timoloL Place 1 drop into the left eye 2 (two) times daily.  ketoconazole daily as needed.  Vyzulta Place 1 drop into both eyes nightly.  vit C-E-cupric-zinc-lutein Take 1 capsule by mouth 2 (two) times daily. SubjectiveHistory of Present Sarah Montes is a 89 year old female who presents with urinary incontinence and bladder training issues.She has experienced two episodes of urinary leakage since her last visit. These episodes occur without a strong urge to urinate, making it difficult to predict when she needs to use the bathroom. She is unable to stop the leakage once it starts and cannot hold it while walking to the bathroom.She uses a watch to remind her to go to the bathroom every hour and a half, but the watch is currently not functioning as it needs to be charged. Without the watch, she finds it challenging to remember when she last went to the bathroom, contributing to her incontinence issues. She uses panty liners, which sometimes make it difficult to determine if she is leaking. She considers not using liners at home to better monitor her condition but uses them when going out to avoid worrying about potential leaks.She is actively engaging in pelvic floor exercises to improve her condition. She describes difficulty in isolating the correct muscles. She is socially active, [facility] [employer] and participating in a writing group. She notes that stress during these activities can exacerbate her symptoms, particularly during her writing group sessions.Chief Complaint:Urinary Urgency IncontinencePertinent History of Current Problem:  The patient presents with chronic bladder issues for evaluation and management. She was referred by Dr. Laural Benes for evaluation of bladder issues.She has experienced chronic bladder issues for  many years, which began after childbirth. She has two children in their fifties and had normal pregnancies but difficult labor and delivery, including episiotomies and the use of low forceps. Previous treatments include bladder sling surgery and a procedure involving electrodes on her ankle (PTNS) to affect bladder nerves, neither of which significantly helped. She has not been able to perform Kegel exercises effectively and has not tried physical therapy for them.She experiences nocturnal enuresis, requiring the use of a pad at night as she does not wake up in time to reach the bathroom before urination begins. This typically occurs once a night, sometimes twice. During the day, she uses a panty liner and changes it about three times, depending on fluid intake and proximity to a bathroom. She urinates about four times a day, despite limited fluid intake, which includes one cup of coffee and minimal water. She tries to drink three glasses of water a day but sometimes gets busy and drinks less.She describes urgency incontinence, where she sometimes does not make it to the bathroom in time, especially if she is distracted or waits too long. She does not experience stress incontinence with physical activities like standing, sitting, bending, or reaching. She has no bladder pain and reports regular bowel movements, although she manages constipation with iron supplements and has stopped prune juice due to adverse effects.Her past surgical history includes a urethrotomy in the 1980s for frequent bladder infections, which have since resolved. She uses estradiol cream twice a week on the urethra, as prescribed by Dr. Laural Benes, to help with urinary issues. She also has a history of hysterectomy at age 44, with no menopausal symptoms noted.12/15/2023 US PELVIS COMPLETE:No free fluid seen. Postoperative changes suspected in the ovaries are not visualized. The study is limited on this transabdominal only exam and could be further assessed on MRI if thought clinically indicated. Past Medical HistoryPast Medical History: Diagnosis Date  Abnormal cervical Papanicolaou smear   Glaucoma   Macular degeneration disease  Past Surgical HistoryPast Surgical History: Procedure Laterality Date  BLADDER SUSPENSION    CATARACT EXTRACTION    FINGER SURGERY Left   cyst  HYSTERECTOMY   AllergiesAdhesive, Alendronate, Alendronic acid, Atorvastatin, Penicillins, Phenylalanine, Tramadol, and OxycodonePain Rating:  0 / 10Educational/Vocational History:  She works as a Contractor virtually Social/Sport/Leisure History:   She maintains an active lifestyle, including going to the gym for chair yoga, cycling, and weightlifting. She experienced a fall from a treadmill last year, resulting in stitches, and now avoids treadmills.Prior Level of Function:  chronic   Change in status from prior level of function?  Chronic incontinenceObjectiveOutcomes Measure not required for this visitPalpation:  Clothed external palpation of the PF in sitting.  Verbal and tactile cues to increase engagement of the coccygeus and decrease gluteal engagement during active contraction phase. Strength 1/5, Relax WNL, Bulge WNL                                     Treatment Provided This VisitCPT Code Interventions Timed Minutes Untimed Minutes Total Minutes Neuromuscular Re-Education [phone]) Patient education in anatomy and the micturition cycleVoiding schedule of every >assisted with understanding her timing watch and how to charge the technology.Introduction to pelvic floor muscle anatomy and exercises to strengthen her pelvic muscles. She acknowledges difficulty in performing Kegel exercises correctly and is learning to engage her pelvic floor muscles without holding her breath. Worked on awareness of co-contractors  with breathing coordination today:Exercises- Seated Gluteal Sets  - 2 x daily - 5-10 reps - 2-3 breath hold- Seated Hip Adduction Isometrics with Ball  - 2 x daily - 5-10 reps - 2-3 breath hold- Seated Hip Abduction with Resistance  - 2 x daily - 5-10 reps - 2-3 breath hold-Seated and Supine Pelvic floor contractions with concentrated effort to decrease co-contractors 40  40 Therapeutic Activity (97530) Reviewed her 2-day bladder log to identify patterns and increase self-awareness of habitsPt education in full voiding techniques- hand out providedReviewed evening strategies including moving bladder irritants (seltzer) to earlier in the day.  No PO 3 hours before bedtime (incl ice cream). Double void in the last hour before sleepDiscussed potentially setting an alarm to wake up before leakage events    N/A     N/A       Total Treatment Time: 40 Problem ListUrge incontinencePelvic floor dysfunctionAssessmentBarbara S Caicedo presents with complaint of urge incontinence. Nitika has experienced urge incontinence for many years, likely exacerbated by childbirth, episiotomies, and a urethrotomy. She reports nocturnal and daytime urinary leakage, requiring pads at night and panty liners during the day. Assessment and PlanPelvic Floor DysfunctionEngaging in pelvic floor exercises to enhance muscle control and reduce urinary incontinence. Exercises emphasize subtle engagement of pelvic floor muscles without involving gluteal muscles, performed in both sitting and lying positions. Regular practice is crucial to strengthen neural pathways for improved muscle control.- Practice pelvic floor exercises with subtle engagement of pelvic floor muscles.- Perform exercises in both sitting and lying positions.- Focus on breathing and avoid engaging gluteal muscles during exercises.- Maintain regular practice to strengthen neural pathways for muscle control.Urinary IncontinenceExperiencing two episodes of urinary leakage this week without a strong urge. Using a watch to remind her to urinate every 90 minutes, but the watch malfunctioned. Goal is to extend the interval between voiding to two hours without leakage. Currently using panty liners, which may hinder awareness of leaks. Discussed strategies to improve awareness, such as avoiding liners at home and using them only when going out. Practicing pelvic floor exercises to enhance muscle control and reduce leakage. Emphasized prompt response to the urge to urinate to reinforce the brain-bladder connection.- Use a watch to remind urination every 90 minutes.- Increase the interval to two hours once able to go 90 minutes without leakage.- Avoid panty liners at home to better detect leaks.- Use panty liners when going out for confidence.- Continue practicing pelvic floor exercises with a focus on subtle muscle engagement.- Respond promptly to the urge to urinate to reinforce brain-bladder communication.Patient / Family / Caregiver EducationDiscussed role of therapyDiscussed the value of collaboration with other providersDiscussed the presenting problemReviewed the assessmentDiscussed plan of care and rationalePatient/Family/Caregiver demonstrate agreement with the planWritten materials / instruction providedRehab PotentialGoodPlanTherapeutic Exercise (97110)Manual Therapy (97140)Therapeutic Activity (97530)Self-Care/Home Management (97535)Neuromuscular Re-Education (97112)Biofeedback Training (90911)Electrical Stimulation - unattended (97014)Electrical Stimulation - attended (97032)Frequency:  1x per weekDuration:  12 weeksPlan for Next VisitBladder log reviewPF assessment with consentInitial PF HEPRecommendationsnoneGoalsShort Term Goals: (will be met in 5-6 visits)1. Patient will be Independent with the initial HEP to aid in increasing and maintaining gains made between PT sessions.2. PERF Testing (pt with right of refusal)3 Pt will report a decrease in night voiding to </= 1 time per night for improved rest. 4. Pt will report ability to delay voiding for at least 5 mins in order to get from bed to bathroom.5 Patient will increase water intake to a total goal of 32-64  ounces per day for overall  wellness and decrease bladder irritation. 6. Patient is able to demonstrate pelvic floor contraction with proper breathing pattern in order to successfully suppress urge to go .Long Term Goals: ( will be met in 10-12 visits )1. Pt will report less than 8 point disability on the UDI-6 for improved subjective ADLs.2. Pt will increase her PF muscle endurance to at least 15 secs to increase ability to hold flow 3. Patient will report increased voiding intervals daytime to at least 2 hours or more evidenced by patient report and/or bladder diary4. Pt will be able to walk 50 % of the time without leakage 5. Pt will increase MMT of pelvic floor strength by at least  1-2 grades from initial assessment  I provided a concise overview of the ambient note generation solution. Rufina Falco or their legally authorized representative verbally consented to a temporary audio recording of their visit to assist with completing the visit documentation using an AI-powered solution. This note was reviewed for accuracy by Lionel December, PT who performed the clinical service.

## 2024-01-24 ENCOUNTER — Inpatient Hospital Stay: Admit: 2024-01-24 | Discharge: 2024-01-24 | Payer: MEDICARE | Primary: Family

## 2024-01-24 DIAGNOSIS — N3941 Urge incontinence: Secondary | ICD-10-CM

## 2024-01-24 DIAGNOSIS — M6289 Other specified disorders of muscle: Secondary | ICD-10-CM

## 2024-01-24 NOTE — Other
 [facility] REHABILITATION [facility] [address] [place] [address]QMVHQ Number: [phone] Number: [phone] signature indicates that you have reviewed and agree with the established Plan of Treatment as documented below. This order renewal is required for therapy to continue on an outpatient basis. Please co-sign this document electronically or return via the fax number listed on the document.  Your prompt response is required and appreciated. Additional Physician Comments:   ___________________________________       __________________________________Physician Signature                                             Date___________________________________Physician Printed NamePhysical Therapy Pelvic Re-EvaluationPatient Name:  Sarah Montes Record Number:  [id number] Date of Birth:  09/17/1932Therapist:  Garfield Cornea, DPTReferring Provider:  Heloise Beecham, MDICD-10 Diagnosis(es):Problem List         ICD-10-CM   Right Leg and Left Knee  Ulcer of right lower leg, with unspecified severity [facility] Code) L97.919   PT-UUI-12/22/23  Pelvic floor dysfunction M62.89  Urgency incontinence N39.41 General InformationTherapy Episode of Care   Date of Visit:  01/24/2024    Treatment Number:  5    Date the Treatment Plan was Initiated/Reviewed:  12/22/2023   Start of Care Date:  12/22/2023    Onset of Illness/Injury Date:  11/16/2023    Progress Report Due Date:  02/23/2024 Precautions/Limitations   Precautions/Limitations:  No known precautions/limitationsNew Neurological Deficit   Did the patient have a new neurological deficit as evidenced by diminished muscle strength   of less than 3 at any time during the 90 days after spine surgery:  N/AInterpreter [employer] Utilized?  NoCognition / Learning Assessment   Primary Learner Relationship:  Patient        Barriers to learning:  Visual and Hearing        Preferred language:  English        Preferred learning style:  DemonstrationI reviewed the Patient Care Agreement and Attendance Form with the Patient/Family.  The Patient/Family verbalized understanding.Rehabilitation GroupingsPrimary Group:  Pelvic Health Secondary Group:  Non-surgical - WomenTertiary Group: IncontinenceMedication Review:Current Outpatient Medications Medication Sig  brimonidine Place 1 drop into both eyes 2 (two) times daily.  calcium carbonate Take 1 tablet (600 mg total) by mouth daily.  cholecalciferol (vitamin D3) Take 1 tablet (1,000 Units total) by mouth daily.  dorzolamide-timoloL Place 1 drop into the left eye 2 (two) times daily.  ketoconazole daily as needed.  Vyzulta Place 1 drop into both eyes nightly.  vit C-E-cupric-zinc-lutein Take 1 capsule by mouth 2 (two) times daily. Subjective4/9 - Sarah Montes arrived today reporting feeling 40% better since IE. Pt reporting she is having less leaking (down to 3 times per week during the day) and reporting decreased amounts of fluid in pads at night. Pt reporting her goals at this time are to increase urge delaying (being able to make it to the bathroom without leaking) and increasing consistency with HEP (PFC particularly).Chief Complaint:Urinary Urgency IncontinencePertinent History of Current Problem:  The patient presents with chronic bladder issues for evaluation and management. She was referred by Dr. Laural Benes for evaluation of bladder issues.She has experienced chronic bladder issues for many years, which began after childbirth. She has two children in their fifties and had normal pregnancies but difficult labor and delivery, including episiotomies and the use of low forceps. Previous treatments include bladder sling surgery  and a procedure involving electrodes on her ankle (PTNS) to affect bladder nerves, neither of which significantly helped. She has not been able to perform Kegel exercises effectively and has not tried physical therapy for them.She experiences nocturnal enuresis, requiring the use of a pad at night as she does not wake up in time to reach the bathroom before urination begins. This typically occurs once a night, sometimes twice. During the day, she uses a panty liner and changes it about three times, depending on fluid intake and proximity to a bathroom. She urinates about four times a day, despite limited fluid intake, which includes one cup of coffee and minimal water. She tries to drink three glasses of water a day but sometimes gets busy and drinks less.She describes urgency incontinence, where she sometimes does not make it to the bathroom in time, especially if she is distracted or waits too long. She does not experience stress incontinence with physical activities like standing, sitting, bending, or reaching. She has no bladder pain and reports regular bowel movements, although she manages constipation with iron supplements and has stopped prune juice due to adverse effects.Her past surgical history includes a urethrotomy in the 1980s for frequent bladder infections, which have since resolved. She uses estradiol cream twice a week on the urethra, as prescribed by Dr. Laural Benes, to help with urinary issues. She also has a history of hysterectomy at age 39, with no menopausal symptoms noted.12/15/2023 US PELVIS COMPLETE:No free fluid seen. Postoperative changes suspected in the ovaries are not visualized. The study is limited on this transabdominal only exam and could be further assessed on MRI if thought clinically indicated. Past Medical HistoryPast Medical History: Diagnosis Date  Abnormal cervical Papanicolaou smear   Glaucoma   Macular degeneration disease  Past Surgical HistoryPast Surgical History: Procedure Laterality Date  BLADDER SUSPENSION    CATARACT EXTRACTION    FINGER SURGERY Left   cyst  HYSTERECTOMY   AllergiesAdhesive, Alendronate, Alendronic acid, Atorvastatin, Penicillins, Phenylalanine, Tramadol, and OxycodonePain Rating:  0 / 10Educational/Vocational History:  She works as a Contractor virtually Social/Sport/Leisure History:   She maintains an active lifestyle, including going to the gym for chair yoga, cycling, and weightlifting. She experienced a fall from a treadmill last year, resulting in stitches, and now avoids treadmills.Prior Level of Function:  chronic   Change in status from prior level of function?  Chronic incontinenceObjectiveInterim/Reassessment/Recert Outcomes Measure completedPelvic HealthHistory   OB/GYN/Pelvic Surgeries:  Partial hysterectomy and POP repairs (bladder sling)   Obstetric History      Gravida:  3      Para:  2      Miscarriages:  1      Episiotomy:  Yes   Infection history: Lower UTI   Endocrine history: Postmenopausal (add date to comments)  Unsure due to hysterectomyTests/Measures   Sexual Function Assessment      Sexual function:  Not active (husband has passed away)Bladder Function   Bladder Habits      Fluid intake, amounts and types:  Coffee, 6oz water (tries for 3 glasses), seltzer at 5pm (stopped drinking seltzer 4/9)      Pad usage:  Yes      Correct continence pads:  Yes      Number of pads used per day:  Eval : 3 liners + 1 pad, 4/9: none during the day at home, wears 1 liner if going out of house and 1 regular pad at night   Bladder Function  Bladder function:  Impaired      Void frequency (per day): 5-6      Nocturia:  Yes         Number of times:  1-2      Bladder urge:  Present      Initiation:  Present      Stream:  Weak      Sense of fully emptying:  Absent      Post void dribble:  Absent      Sense of urgency:  Present Straining:  Absent      UUI: Yes      Amount:      Frequency: almost daily      SUI: No      AY:TKZSW Function   Bowel function:  Within normal limits   Straining: Absent      Constipation: Yes   Fecal incontinence: NoPhysical Exam   Posture:  Within  normal limits   Flexibility:     Not assessedPelvic Floor Evaluation   Pelvic exam explained to patient:  Patient defers at this time, option to consent in futureOutcome Measures   Pelvic Floor Distress Inventory (PFDI 20)      Scoring: 0=Not present (never experienced)  1=Not at all (experienced previously)  2=Somewhat   3=Moderately   4=Quite a bit   Pelvic Organ Prolapse Distress Inventory 6 (POPDI-6)      1. Usually experience pressure in lower abdomen?  0       2. Usually experience heaviness/dullness in pelvic area? 0       3. Usually have bulge/something falling out you can see/feel in vaginal area? 0       4. Ever have to push on vagina/around rectum to have complete bowel movement?  0      5. Usually experience feeling of incomplete bladder emptying?  0      6. Ever have to push on bulge in vaginal area with fingers to start/complete urination?  0      TOTAL Score:  0   Colorectal-Anal Distress inventory 8 (CRAD-8)      7. Feel need to strain too hard to have bowel movement?  0      8. Feel you have not completely emptied your bowels at end of bowel movement?  0      9. Usually lose stool beyond your control if your stool is well formed?  0      10. Usually lose stool beyond your control if stool is loose?  0      11. Usually lose gas from the rectum beyond your control?  0      12. Usually have pain when you pass stool?  0      13. Experience strong sense of urgency/rush to bathroom to have bowel movement?  0      14. Does part of bowel ever pass through rectum/bulge outside during/after bowel movement?  0      TOTAL Score:  0   Urinary Distress Inventory 6 (UDI-6)      15. Usually experience frequent urination?  0      16. Usually experience urine leakage associated with feeling of urgency?  3      17. Usually experience urine leakage related to coughing, sneezing, laughing?  0      18. Usually experience small amounts of urine leakage?  3      19. Usually experience difficulty emptying bladder?  0      20.  Usually experience pain/discomfort in lower abdomen/genital region?  0      TOTAL Score:  6Overall Comments   PFDI-20: 12 ptsUDI-6: 10 pts4/9:POPDI6: 0UDI6: 6                                     Treatment Provided This VisitCPT Code Interventions Timed Minutes Untimed Minutes Total Minutes Therapeutic Activity [phone]) - Reevaluation with review of how she has made progress in the past month since starting PF PT.- review of urge delay techniques (paper given) d/t increased leaking while walking to the bathroom and pt reporting rushing to get there in time- continuation of 90 minute voiding time, educated on how to use timer on phone while waiting for voiding watch to come in mail. Educated on not attempting to urinate if she doesn't have any type of urge, attempting increased voiding time next session- education on anatomy and muscles of pelvic floor and how they contract 40  40 Therapeutic Exercise (97110) - standing 360 breathing with hands resting on stomach- seated 360 breathing with hands resting on stomach w/ TA contraction on exhale- seated PFC pulling the space between the vaginal canal and anal opening upwards (pt reporting she felt a stronger contraction with this cue and no significant compensation seen) - 3x 10 reps / day- educated on pressure management throughout movements, how exhaling will decrease risk of leaking 20  20 N/A     N/A       Total Treatment Time: 60 Problem ListUrge incontinencePelvic floor dysfunctionAssessment4/9 - Sarah Montes has presented to 6th PF PT session today including IE. Sarah Montes continues to experience urinary incontinence mostly associated with walking to the bathroom. Review of urge delay techniques done today and perceived well by pt. Carah has demonstrated increased improvement on all objective measures taken (decreased UDI score from 10 to 6 and POPDI at 0 now). Sarah Montes has met 2 out of 6 STG however is making progress towards others. Pt also reporting leaking was daily when starting PF PT and now is decreased to 3x/week. Sarah Montes was able to demonstrate an increased understanding of musculature today and felt a stronger muscle contraction with different verbal cueing. At this time, Sarah Montes would benefit from continued skilled services d/t difficulty with coordinating pelvic floor contraction. Sarah Montes presents with complaint of urge incontinence. Sarah Montes has experienced urge incontinence for many years, likely exacerbated by childbirth, episiotomies, and a urethrotomy. She reports nocturnal and daytime urinary leakage, requiring pads at night and panty liners during the day. Previous treatments, including a bladder sling and neuromodulation therapy, were ineffective. She has difficulty performing Kegel exercises and has not undergone physical therapy. She prefers non-pharmacological treatments and is hesitant about surgical options due to potential side effects. The primary goal is to improve her urinary control. These impairments are consistent with a pelvic floor dysfunction.  They will benefit from skilled physical therapy to address these impairments and optimize their functional activities.  A pelvic floor muscle assessment was explained in detail to [facility] today and will be performed next session to obtain greater objective data and assist in differential diagnosis. Physical Therapy may include therapeutic exercises, neuromuscular re-education, therapeutic activities, manual therapy, and biofeedback as needed.  Patient / Family / Caregiver EducationDiscussed role of therapyDiscussed the value of collaboration with other providersDiscussed the presenting problemReviewed the assessmentDiscussed plan of care and rationalePatient/Family/Caregiver demonstrate agreement with the planWritten materials / instruction providedRehab PotentialGoodPlanTherapeutic  Exercise (97110)Manual Therapy (97140)Therapeutic Activity (97530)Self-Care/Home Management (97535)Neuromuscular Re-Education (97112)Biofeedback Training (90911)Electrical Stimulation - unattended (97014)Electrical Stimulation - attended (97032)Frequency:  1x per weekDuration:  8 weeksPlan for Next VisitBladder log reviewPF assessment with consentInitial PF HEPRecommendationsnoneGoalsShort Term Goals: (will be met in 5-6 visits)1. Patient will be Independent with the initial HEP to aid in increasing and maintaining gains made between PT sessions. MET 4/92. PERF Testing (pt with right of refusal)3 Pt will report a decrease in night voiding to </= 1 time per night for improved rest.  WORKING TOWARDS4. Pt will report ability to delay voiding for at least 5 mins in order to get from bed to bathroom. 50%5 Patient will increase water intake to a total goal of 32-64  ounces per day for overall wellness and decrease bladder irritation. WORKING TOWARDS6. Patient is able to demonstrate pelvic floor contraction with proper breathing pattern in order to successfully suppress urge to go . MET 4/9Long Term Goals: ( will be met in 10-12 visits )1. Pt will report less than 8 point disability on the UDI-6 for improved subjective ADLs.2. Pt will increase her PF muscle endurance to at least 15 secs to increase ability to hold flow 3. Patient will report increased voiding intervals daytime to at least 2 hours or more evidenced by patient report and/or bladder diary4. Pt will be able to walk 50 % of the time without leakage 5. Pt will increase MMT of pelvic floor strength by at least  1-2 grades from initial assessment

## 2024-01-31 ENCOUNTER — Inpatient Hospital Stay: Admit: 2024-01-31 | Discharge: 2024-01-31 | Payer: PRIVATE HEALTH INSURANCE | Primary: Family

## 2024-01-31 DIAGNOSIS — N3941 Urge incontinence: Secondary | ICD-10-CM

## 2024-01-31 DIAGNOSIS — M6289 Other specified disorders of muscle: Secondary | ICD-10-CM

## 2024-01-31 NOTE — Other
 [facility] REHABILITATION [facility] [place] [address]WUJWJ Number: [phone] Number: [phone] Therapy Pelvic Daily / Progress NotePatient Name:  Sarah Montes Record Number:  [id number] Date of Birth:  04/07/1932Therapist:  Geraldine Kling, DPTReferring Provider:  Katelyn Kane Johnson, MDICD-10 Diagnosis(es):Problem List         ICD-10-CM   Right Leg and Left Knee  Ulcer of right lower leg, with unspecified severity [facility] Code) L97.919   PT-UUI-12/22/23  Pelvic floor dysfunction M62.89  Urgency incontinence N39.41 General InformationTherapy Episode of Care   Date of Visit:  01/31/2024    Treatment Number:  6    Date the Treatment Plan was Initiated/Reviewed:  12/22/2023   Start of Care Date:  12/22/2023    Onset of Illness/Injury Date:  11/16/2023    Progress Report Due Date:  02/23/2024 Precautions/Limitations   Precautions/Limitations:  No known precautions/limitationsNew Neurological Deficit   Did the patient have a new neurological deficit as evidenced by diminished muscle strength   of less than 3 at any time during the 90 days after spine surgery:  N/AInterpreter [employer] Utilized?  NoCognition / Learning Assessment   Primary Learner Relationship:  Patient        Barriers to learning:  Visual and Hearing        Preferred language:  English        Preferred learning style:  DemonstrationI reviewed the Patient Care Agreement and Attendance Form with the Patient/Family.  The Patient/Family verbalized understanding.Rehabilitation GroupingsPrimary Group:  Pelvic Health Secondary Group:  Non-surgical - WomenTertiary Group: IncontinenceMedication Review:Current Outpatient Medications Medication Sig  brimonidine Place 1 drop into both eyes 2 (two) times daily.  calcium carbonate Take 1 tablet (600 mg total) by mouth daily.  cholecalciferol (vitamin D3) Take 1 tablet (1,000 Units total) by mouth daily.  dorzolamide-timoloL Place 1 drop into the left eye 2 (two) times daily.  ketoconazole daily as needed.  Vyzulta Place 1 drop into both eyes nightly.  vit C-E-cupric-zinc-lutein Take 1 capsule by mouth 2 (two) times daily. SubjectiveHistory of Present Sarah Montes is a 89 year old female who presents for follow-up on bladder control and pelvic floor strengthening.She experiences significant urinary urgency and incontinence, with a strong urge to urinate that often results in leakage before reaching the bathroom. She recently had a successful instance of reaching the bathroom without leaking after feeling an urge.She uses a watch that buzzes every ninety minutes to remind her to use the bathroom. Adhering to this schedule generally prevents leakage, but she finds it challenging to maintain when out of the house due to her active lifestyle, which includes going to the gym, shopping, and running errands. She has stopped using panty liners to better monitor her leakage, which is usually a small amount rather than a full bladder emptying. She is cautious with fluid intake to avoid dehydration.At night, she wakes up one to two times to urinate and sometimes starts urinating before reaching the bathroom. This nocturia affects her sleep, as she has difficulty returning to sleep after waking to urinate.She practices pelvic floor exercises to improve bladder control, focusing on coordinating breathing with pelvic floor contractions.Chief Complaint:Urinary Urgency IncontinencePertinent History of Current Problem:  The patient presents with chronic bladder issues for evaluation and management. She was referred by Dr. Lincoln Renshaw for evaluation of bladder issues.She has experienced chronic bladder issues for many years, which began after childbirth. She has two children in their fifties and had normal pregnancies but difficult labor and delivery,  including episiotomies and the use of low forceps. Previous treatments include bladder sling surgery and a procedure involving electrodes on her ankle (PTNS) to affect bladder nerves, neither of which significantly helped. She has not been able to perform Kegel exercises effectively and has not tried physical therapy for them.She experiences nocturnal enuresis, requiring the use of a pad at night as she does not wake up in time to reach the bathroom before urination begins. This typically occurs once a night, sometimes twice. During the day, she uses a panty liner and changes it about three times, depending on fluid intake and proximity to a bathroom. She urinates about four times a day, despite limited fluid intake, which includes one cup of coffee and minimal water. She tries to drink three glasses of water a day but sometimes gets busy and drinks less.She describes urgency incontinence, where she sometimes does not make it to the bathroom in time, especially if she is distracted or waits too long. She does not experience stress incontinence with physical activities like standing, sitting, bending, or reaching. She has no bladder pain and reports regular bowel movements, although she manages constipation with iron supplements and has stopped prune juice due to adverse effects.Her past surgical history includes a urethrotomy in the 1980s for frequent bladder infections, which have since resolved. She uses estradiol cream twice a week on the urethra, as prescribed by Dr. Lincoln Renshaw, to help with urinary issues. She also has a history of hysterectomy at age 87, with no menopausal symptoms noted.12/15/2023 US  PELVIS COMPLETE:No free fluid seen. Postoperative changes suspected in the ovaries are not visualized. The study is limited on this transabdominal only exam and could be further assessed on MRI if thought clinically indicated. Past Medical HistoryPast Medical History: Diagnosis Date  Abnormal cervical Papanicolaou smear   Glaucoma   Macular degeneration disease  Past Surgical HistoryPast Surgical History: Procedure Laterality Date  BLADDER SUSPENSION    CATARACT EXTRACTION    FINGER SURGERY Left   cyst  HYSTERECTOMY   AllergiesAdhesive, Alendronate, Alendronic acid, Atorvastatin, Penicillins, Phenylalanine, Tramadol, and OxycodonePain Rating:  0 / 10Educational/Vocational History:  She works as a Contractor virtually Social/Sport/Leisure History:   She maintains an active lifestyle, including going to the gym for chair yoga, cycling, and weightlifting. She experienced a fall from a treadmill last year, resulting in stitches, and now avoids treadmills.Prior Level of Function:  chronic   Change in status from prior level of function?  Chronic incontinenceObjectiveInterim/Reassessment/Recert Outcomes Measure completedPelvic HealthHistory   OB/GYN/Pelvic Surgeries:  Partial hysterectomy and POP repairs (bladder sling)   Obstetric History      Gravida:  3      Para:  2      Miscarriages:  1      Episiotomy:  Yes   Infection history: Lower UTI   Endocrine history: Postmenopausal (add date to comments)  Unsure due to hysterectomyTests/Measures   Sexual Function Assessment      Sexual function:  Not active (husband has passed away)Bladder Function   Bladder Habits      Fluid intake, amounts and types:  Coffee, 6oz water (tries for 3 glasses), seltzer at 5pm (stopped drinking seltzer 4/9)      Pad usage:  Yes      Correct continence pads:  Yes      Number of pads used per day:  Eval : 3 liners + 1 pad, 4/9: none during the day at home, wears 1 liner if going out of house and  1 regular pad at night   Bladder Function      Bladder function:  Impaired      Void frequency (per day): 5-6      Nocturia:  Yes         Number of times:  1-2      Bladder urge:  Present      Initiation:  Present      Stream:  Weak      Sense of fully emptying:  Absent      Post void dribble:  Absent      Sense of urgency:  Present      Straining:  Absent      UUI: Yes      Amount:      Frequency: almost daily      SUI: No      IE:PPIRJ Function   Bowel function:  Within normal limits   Straining: Absent      Constipation: Yes   Fecal incontinence: NoPhysical Exam   Posture:  Within  normal limits   Flexibility:     Not assessedPelvic Floor Evaluation   Pelvic exam explained to patient:  Patient defers at this time, option to consent in futureOutcome Measures   Pelvic Floor Distress Inventory (PFDI 20)      Scoring: 0=Not present (never experienced)  1=Not at all (experienced previously)  2=Somewhat   3=Moderately   4=Quite a bit   Pelvic Organ Prolapse Distress Inventory 6 (POPDI-6)      1. Usually experience pressure in lower abdomen?  0       2. Usually experience heaviness/dullness in pelvic area? 0       3. Usually have bulge/something falling out you can see/feel in vaginal area? 0       4. Ever have to push on vagina/around rectum to have complete bowel movement?  0      5. Usually experience feeling of incomplete bladder emptying?  0      6. Ever have to push on bulge in vaginal area with fingers to start/complete urination?  0      TOTAL Score:  0   Colorectal-Anal Distress inventory 8 (CRAD-8)      7. Feel need to strain too hard to have bowel movement?  0      8. Feel you have not completely emptied your bowels at end of bowel movement?  0      9. Usually lose stool beyond your control if your stool is well formed?  0      10. Usually lose stool beyond your control if stool is loose?  0      11. Usually lose gas from the rectum beyond your control?  0      12. Usually have pain when you pass stool?  0      13. Experience strong sense of urgency/rush to bathroom to have bowel movement?  0      14. Does part of bowel ever pass through rectum/bulge outside during/after bowel movement?  0      TOTAL Score:  0   Urinary Distress Inventory 6 (UDI-6)      15. Usually experience frequent urination?  0      16. Usually experience urine leakage associated with feeling of urgency?  3      17. Usually experience urine leakage related to coughing, sneezing, laughing?  0      18. Usually experience small amounts of urine leakage?  3  19. Usually experience difficulty emptying bladder?  0      20. Usually experience pain/discomfort in lower abdomen/genital region?  0      TOTAL Score:  6Overall Comments   PFDI-20: 12 ptsUDI-6: 10 pts4/9:POPDI6: 0UDI6: 64/16/25: Palpation: Seated external / clothed palpation of the left PF in sitting.  Poor contraction.  Flicker felt. Minimal/ no gluteal contractions                                     Treatment Provided This VisitCPT Code Interventions Timed Minutes Untimed Minutes Total Minutes Therapeutic Activity [phone]) - review of urge delay techniques (paper given) d/t increased leaking while walking to the bathroom and pt reporting rushing to get there in time- continuation of 90 minute voiding time, educated on how to use timer on phone while waiting for voiding watch to come in mail.- education on anatomy and muscles of pelvic floor and how they contract 25  25 Neuromuscular Re-Education (97112) - seated 360 breathing with hands resting on stomach w/ TA contraction on exhale  >functional pressure management- seated PFC pulling the space between the vaginal canal and anal opening upwards - 3x 10 reps / day- educated on pressure management throughout movements, how exhaling will decrease risk of leaking-quick flicks for urgency management-long holds (1-2 breaths) for endurance  25  25 N/A     N/A       Total Treatment Time: 50 Problem ListUrge incontinencePelvic floor dysfunctionAssessmentUrinary IncontinenceExperiencing urinary incontinence with urgency and occasional leakage, particularly when standing or unable to access a bathroom promptly. Timed voiding schedule with a watch set to buzz every ninety minutes has reduced leakage when adhered to. Struggles with leakage when out and unable to access a bathroom at the ninety-minute mark. Reports a recent successful experience of reaching the bathroom without leaking, indicating some improvement. Working on pelvic floor muscle strengthening exercises to improve control and reduce leakage. Exercises focus on brain retraining and muscle strengthening, with emphasis on proper technique and breathing. Aware of the need to avoid dehydration and is trying to increase fluid intake. Experiencing nocturia, waking up once or twice a night to urinate, which is considered age-appropriate, but concerned about waking up already urinating in the morning. Discussed the importance of recognizing early bladder signals, such as a flicker sensation, to improve awareness and control.- Continue with the ninety-minute timed voiding schedule.- Increase the interval to one hundred minutes if able to do so without leakage.- Continue pelvic floor muscle strengthening exercises as instructed.- Consider setting an earlier alarm to wake up and urinate before the bladder is full in the morning.- Plan for hands-on pelvic floor therapy at the next visit.Patient / Family / Caregiver EducationDiscussed role of therapyDiscussed the value of collaboration with other providersDiscussed the presenting problemReviewed the assessmentDiscussed plan of care and rationalePatient/Family/Caregiver demonstrate agreement with the planWritten materials / instruction providedRehab PotentialGoodPlanTherapeutic Exercise (97110)Manual Therapy (97140)Therapeutic Activity (97530)Self-Care/Home Management (97535)Neuromuscular Re-Education (97112)Biofeedback Training (90911)Electrical Stimulation - unattended (97014)Electrical Stimulation - attended (97032)Frequency:  1x per weekDuration:  8 weeksPlan for Next VisitBladder log reviewPF assessment with consentInitial PF HEPRecommendationsnoneGoalsShort Term Goals: (will be met in 5-6 visits)1. Patient will be Independent with the initial HEP to aid in increasing and maintaining gains made between PT sessions. MET 4/92. PERF Testing (pt with right of refusal)3 Pt will report a decrease in night voiding to </= 1 time per night for improved rest.  WORKING TOWARDS4.  Pt will report ability to delay voiding for at least 5 mins in order to get from bed to bathroom. 50%5 Patient will increase water intake to a total goal of 32-64  ounces per day for overall wellness and decrease bladder irritation. WORKING TOWARDS6. Patient is able to demonstrate pelvic floor contraction with proper breathing pattern in order to successfully suppress urge to go . MET 4/9Long Term Goals: ( will be met in 10-12 visits )1. Pt will report less than 8 point disability on the UDI-6 for improved subjective ADLs.2. Pt will increase her PF muscle endurance to at least 15 secs to increase ability to hold flow 3. Patient will report increased voiding intervals daytime to at least 2 hours or more evidenced by patient report and/or bladder diary4. Pt will be able to walk 50 % of the time without leakage 5. Pt will increase MMT of pelvic floor strength by at least  1-2 grades from initial assessment

## 2024-02-07 ENCOUNTER — Inpatient Hospital Stay: Admit: 2024-02-07 | Discharge: 2024-02-07 | Payer: PRIVATE HEALTH INSURANCE | Primary: Family

## 2024-02-07 DIAGNOSIS — M6289 Other specified disorders of muscle: Secondary | ICD-10-CM

## 2024-02-07 DIAGNOSIS — N3941 Urge incontinence: Secondary | ICD-10-CM

## 2024-02-07 NOTE — Other
 [facility] REHABILITATION [facility] [place] [address] Number: [phone] Number: [phone] Therapy Pelvic Daily / Progress NotePatient Name:  Sarah Montes Record Number:  [id number] Date of Birth:  10-02-1932Therapist:  Geraldine Kling, DPTReferring Provider:  Katelyn Kane Johnson, MDICD-10 Diagnosis(es):Problem List         ICD-10-CM   Right Leg and Left Knee  Ulcer of right lower leg, with unspecified severity [facility] Code) L97.919   PT-UUI-12/22/23  Pelvic floor dysfunction M62.89  Urgency incontinence N39.41 General InformationTherapy Episode of Care   Date of Visit:  02/07/2024    Treatment Number:  7    Date the Treatment Plan was Initiated/Reviewed:  12/22/2023   Start of Care Date:  12/22/2023    Onset of Illness/Injury Date:  11/16/2023    Progress Report Due Date:  02/23/2024 Precautions/Limitations   Precautions/Limitations:  No known precautions/limitationsNew Neurological Deficit   Did the patient have a new neurological deficit as evidenced by diminished muscle strength   of less than 3 at any time during the 90 days after spine surgery:  N/AInterpreter [employer] Utilized?  NoCognition / Learning Assessment   Primary Learner Relationship:  Patient        Barriers to learning:  Visual and Hearing        Preferred language:  English        Preferred learning style:  DemonstrationI reviewed the Patient Care Agreement and Attendance Form with the Patient/Family.  The Patient/Family verbalized understanding.Rehabilitation GroupingsPrimary Group:  Pelvic Health Secondary Group:  Non-surgical - WomenTertiary Group: IncontinenceMedication Review:Current Outpatient Medications Medication Sig  brimonidine  Place 1 drop into both eyes 2 (two) times daily.  calcium carbonate Take 1 tablet (600 mg total) by mouth daily.  cholecalciferol  (vitamin D3) Take 1 tablet (1,000 Units total) by mouth daily.  dorzolamide -timoloL  Place 1 drop into the left eye 2 (two) times daily.  ketoconazole daily as needed.  Vyzulta  Place 1 drop into both eyes nightly.  vit C-E-cupric-zinc-lutein  Take 1 capsule by mouth 2 (two) times daily. Sarah Montes is a 89 year old female who presents with difficulty managing urinary incontinence during work and daily activities.She experiences urinary incontinence, particularly when working from home. Despite her office being close to the bathroom, she has 'leaks' before reaching it. She has tried various strategies to prevent leakage, including using a watch alarm to remind her to go to the bathroom every ninety minutes, which helps her stay dry. Recently, she has started to feel a 'flicker' sensation indicating bladder fullness, which is new for her.Her work schedule is busy with back-to-back appointments, making it difficult to take breaks. She acknowledges drinking more water while working, which may contribute to her increased need to urinate. She sometimes wakes up wet during the night, despite going to the bathroom multiple times. She describes a particularly difficult night where she woke up wet several times and eventually decided not to use another pad.She has been practicing pelvic floor exercises to improve bladder control. She feels unsure if she is performing them correctly and is feeling frustrated.Chief Complaint:Urinary Urgency IncontinencePertinent History of Current Problem:  The patient presents with chronic bladder issues for evaluation and management. She was referred by Dr. Lincoln Renshaw for evaluation of bladder issues.She has experienced chronic bladder issues for many years, which began after childbirth. She has two children in their fifties and had normal pregnancies but difficult labor and delivery, including episiotomies and the use of low forceps. Previous treatments include bladder sling surgery  and a procedure involving electrodes on her ankle (PTNS) to affect bladder nerves, neither of which significantly helped. She has not been able to perform Kegel exercises effectively and has not tried physical therapy for them.She experiences nocturnal enuresis, requiring the use of a pad at night as she does not wake up in time to reach the bathroom before urination begins. This typically occurs once a night, sometimes twice. During the day, she uses a panty liner and changes it about three times, depending on fluid intake and proximity to a bathroom. She urinates about four times a day, despite limited fluid intake, which includes one cup of coffee and minimal water. She tries to drink three glasses of water a day but sometimes gets busy and drinks less.She describes urgency incontinence, where she sometimes does not make it to the bathroom in time, especially if she is distracted or waits too long. She does not experience stress incontinence with physical activities like standing, sitting, bending, or reaching. She has no bladder pain and reports regular bowel movements, although she manages constipation with iron supplements and has stopped prune juice due to adverse effects.Her past surgical history includes a urethrotomy in the 1980s for frequent bladder infections, which have since resolved. She uses estradiol cream twice a week on the urethra, as prescribed by Dr. Lincoln Renshaw, to help with urinary issues. She also has a history of hysterectomy at age 65, with no menopausal symptoms noted.12/15/2023 US  PELVIS COMPLETE:No free fluid seen. Postoperative changes suspected in the ovaries are not visualized. The study is limited on this transabdominal only exam and could be further assessed on MRI if thought clinically indicated. Past Medical HistoryPast Medical History: Diagnosis Date  Abnormal cervical Papanicolaou smear   Glaucoma   Macular degeneration disease  Past Surgical HistoryPast Surgical History: Procedure Laterality Date  BLADDER SUSPENSION    CATARACT EXTRACTION    FINGER SURGERY Left   cyst  HYSTERECTOMY   AllergiesAdhesive, Alendronate, Alendronic acid, Atorvastatin, Penicillins, Phenylalanine, Tramadol , and OxycodonePain Rating:  0 / 10Educational/Vocational History:  She works as a Contractor virtually Social/Sport/Leisure History:   She maintains an active lifestyle, including going to the gym for chair yoga, cycling, and weightlifting. She experienced a fall from a treadmill last year, resulting in stitches, and now avoids treadmills.Prior Level of Function:  chronic   Change in status from prior level of function?  Chronic incontinenceObjectiveInterim/Reassessment/Recert Outcomes Measure completedPelvic HealthHistory   OB/GYN/Pelvic Surgeries:  Partial hysterectomy and POP repairs (bladder sling)   Obstetric History      Gravida:  3      Para:  2      Miscarriages:  1      Episiotomy:  Yes   Infection history: Lower UTI   Endocrine history: Postmenopausal (add date to comments)  Unsure due to hysterectomyTests/Measures   Sexual Function Assessment      Sexual function:  Not active (husband has passed away)Bladder Function   Bladder Habits      Fluid intake, amounts and types:  Coffee, 6oz water (tries for 3 glasses), seltzer at 5pm (stopped drinking seltzer 4/9)      Pad usage:  Yes      Correct continence pads:  Yes      Number of pads used per day:  Eval : 3 liners + 1 pad, 4/9: none during the day at home, wears 1 liner if going out of house and 1 regular pad at night   Bladder Function  Bladder function:  Impaired      Void frequency (per day): 5-6      Nocturia:  Yes         Number of times:  1-2      Bladder urge:  Present Initiation:  Present      Stream:  Weak      Sense of fully emptying:  Absent      Post void dribble:  Absent      Sense of urgency:  Present      Straining:  Absent      UUI: Yes      Amount:      Frequency: almost daily      SUI: No      CZ:YSAYT Function   Bowel function:  Within normal limits   Straining: Absent      Constipation: Yes   Fecal incontinence: NoPhysical Exam   Posture:  Within  normal limits   Flexibility:     Not assessedPelvic Floor Evaluation   Pelvic exam explained to patient:  Patient defers at this time, option to consent in futureOutcome Measures   Pelvic Floor Distress Inventory (PFDI 20)      Scoring: 0=Not present (never experienced)  1=Not at all (experienced previously)  2=Somewhat   3=Moderately   4=Quite a bit   Pelvic Organ Prolapse Distress Inventory 6 (POPDI-6)      1. Usually experience pressure in lower abdomen?  0       2. Usually experience heaviness/dullness in pelvic area? 0       3. Usually have bulge/something falling out you can see/feel in vaginal area? 0       4. Ever have to push on vagina/around rectum to have complete bowel movement?  0      5. Usually experience feeling of incomplete bladder emptying?  0      6. Ever have to push on bulge in vaginal area with fingers to start/complete urination?  0      TOTAL Score:  0   Colorectal-Anal Distress inventory 8 (CRAD-8)      7. Feel need to strain too hard to have bowel movement?  0      8. Feel you have not completely emptied your bowels at end of bowel movement?  0      9. Usually lose stool beyond your control if your stool is well formed?  0      10. Usually lose stool beyond your control if stool is loose?  0      11. Usually lose gas from the rectum beyond your control?  0      12. Usually have pain when you pass stool?  0      13. Experience strong sense of urgency/rush to bathroom to have bowel movement?  0      14. Does part of bowel ever pass through rectum/bulge outside during/after bowel movement?  0      TOTAL Score:  0   Urinary Distress Inventory 6 (UDI-6)      15. Usually experience frequent urination?  0      16. Usually experience urine leakage associated with feeling of urgency?  3      17. Usually experience urine leakage related to coughing, sneezing, laughing?  0      18. Usually experience small amounts of urine leakage?  3      19. Usually experience difficulty emptying bladder?  0      20.  Usually experience pain/discomfort in lower abdomen/genital region?  0      TOTAL Score:  6Overall Comments   PFDI-20: 12 ptsUDI-6: 10 pts4/9:POPDI6: 0UDI6: 64/16/25: Palpation: Seated external / clothed palpation of the left PF in sitting.  Poor contraction.  Flicker felt. Minimal/ no gluteal contractions                                     Treatment Provided This VisitCPT Code Interventions Timed Minutes Untimed Minutes Total Minutes Therapeutic Activity [phone]) - review of urge delay techniques (paper given) d/t increased leaking while walking to the bathroom and pt reporting rushing to get there in time- continuation of 90 minute voiding time, educated on how to use timer on phone while waiting for voiding watch to come in mail.Listening to her body and neuromuscular re-education to increase bladder pressure awareness 15  15 Neuromuscular Re-Education (97112) - seated PFC pulling the space between the vaginal canal and anal opening upwards - hold with breathing- Used dynadisc to increase pressure/ awareness of the perineumAnt pelvic tilt decreased gluteal activation  25  25 N/A     N/A       Total Treatment Time: 40 Problem ListUrge incontinencePelvic floor dysfunctionAssessmentAssessment and PlanUrinary incontinenceChronic urinary incontinence with recent urgency and leakage, especially during work hours. Increased fluid intake and difficulty reaching the bathroom in time are contributing factors. Neuromuscular awareness of bladder filling is improving, with a 'flicker' sensation indicating bladder fullness. Pelvic floor muscle strengthening is ongoing but progress is slow. The goal is to enhance neuromuscular control and extend the interval between bathroom visits to two hours.- Use external cues, such as a watch alarm, to remind her to take bathroom breaks every 90 minutes during work hours.- Listen to body signals, such as the 'flicker' sensation, to determine when to go to the bathroom when not working.- Perform pelvic floor strengthening exercises, focusing on lifting and pulling in the pelvic floor muscles, several times a day in small sets.- Gradually test extending bathroom breaks to two hours when possible, monitoring for dryness.- Reassess in two weeks to evaluate progress and consider referral to urology if no improvement is noted.Patient / Family / Caregiver EducationDiscussed role of therapyDiscussed the value of collaboration with other providersDiscussed the presenting problemReviewed the assessmentDiscussed plan of care and rationalePatient/Family/Caregiver demonstrate agreement with the planWritten materials / instruction providedRehab PotentialGoodPlanTherapeutic Exercise (97110)Manual Therapy (97140)Therapeutic Activity (97530)Self-Care/Home Management (97535)Neuromuscular Re-Education (97112)Biofeedback Training (90911)Electrical Stimulation - unattended (97014)Electrical Stimulation - attended (97032)Frequency:  1x per weekDuration:  8 weeksPlan for Next VisitBladder log reviewPF assessment with consentInitial PF HEPRecommendationsnoneGoalsShort Term Goals: (will be met in 5-6 visits)1. Patient will be Independent with the initial HEP to aid in increasing and maintaining gains made between PT sessions. MET 4/92. PERF Testing (pt with right of refusal)3 Pt will report a decrease in night voiding to </= 1 time per night for improved rest.  WORKING TOWARDS4. Pt will report ability to delay voiding for at least 5 mins in order to get from bed to bathroom. 50%5 Patient will increase water intake to a total goal of 32-64  ounces per day for overall wellness and decrease bladder irritation. WORKING TOWARDS6. Patient is able to demonstrate pelvic floor contraction with proper breathing pattern in order to successfully suppress urge to go . MET 4/9Long Term Goals: ( will be met in 10-12 visits )1. Pt will report less than 8 point disability on the  UDI-6 for improved subjective ADLs.2. Pt will increase her PF muscle endurance to at least 15 secs to increase ability to hold flow 3. Patient will report increased voiding intervals daytime to at least 2 hours or more evidenced by patient report and/or bladder diary4. Pt will be able to walk 50 % of the time without leakage 5. Pt will increase MMT of pelvic floor strength by at least  1-2 grades from initial assessment

## 2024-02-16 ENCOUNTER — Ambulatory Visit: Admit: 2024-02-16 | Payer: PRIVATE HEALTH INSURANCE | Primary: Family

## 2024-03-15 ENCOUNTER — Inpatient Hospital Stay: Admit: 2024-03-15 | Discharge: 2024-03-15 | Payer: PRIVATE HEALTH INSURANCE | Primary: Family

## 2024-03-15 DIAGNOSIS — M6289 Other specified disorders of muscle: Principal | ICD-10-CM

## 2024-03-15 DIAGNOSIS — N3941 Urge incontinence: Secondary | ICD-10-CM

## 2024-03-15 NOTE — Other
 [facility] REHABILITATION [facility] Rehabilitation [place] [id number] Number: [phone] Number: [phone] signature indicates that you have reviewed and agree with the established Plan of Treatment as documented below. This order renewal is required for therapy to continue on an outpatient basis. Please co-sign this document electronically or return via the fax number listed on the document.  Your prompt response is required and appreciated. Additional Physician Comments:   ___________________________________       __________________________________Physician Signature                                             Date___________________________________Physician Printed NamePhysical Therapy Pelvic  Re-EvaluationPatient Name:  Sarah Montes Record Number:  [id number] Date of Birth:  [birth year]Therapist:  Geraldine Kling, DPTReferring Provider:  Katelyn Kane Johnson, MDICD-10 Diagnosis(es):Problem List         ICD-10-CM   Right Leg and Left Knee  Ulcer of right lower leg, with unspecified severity [facility] Code)  L97.919   PT-UUI-12/22/23  Pelvic floor dysfunction M62.89  Urgency incontinence N39.41 General InformationTherapy Episode of Care   Date of Visit:  02/07/2024    Treatment Number:  7    Date the Treatment Plan was Initiated/Reviewed:  12/22/2023   Start of Care Date:  12/22/2023    Onset of Illness/Injury Date:  11/16/2023    Progress Report Due Date:  02/23/2024 Precautions/Limitations   Precautions/Limitations:  No known precautions/limitationsNew Neurological Deficit   Did the patient have a new neurological deficit as evidenced by diminished muscle strength   of less than 3 at any time during the 90 days after spine surgery:  N/AInterpreter [employer] Utilized?  NoCognition / Learning Assessment   Primary Learner Relationship:  Patient        Barriers to learning:  Visual and Hearing        Preferred language:  English        Preferred learning style:  DemonstrationI reviewed the Patient Care Agreement and Attendance Form with the Patient/Family.  The Patient/Family verbalized understanding.Rehabilitation GroupingsPrimary Group:  Pelvic Health Secondary Group:  Non-surgical - WomenTertiary Group: IncontinenceMedication Review:Current Outpatient Medications Medication Sig  brimonidine  Place 1 drop into both eyes 2 (two) times daily.  calcium  carbonate Take 1 tablet (600 mg total) by mouth daily.  cholecalciferol  (vitamin D3) Take 1 tablet (1,000 Units total) by mouth daily.  dorzolamide -timoloL  Place 1 drop into the left eye 2 (two) times daily.  ketoconazole  daily as needed.  Vyzulta  Place 1 drop into both eyes nightly.  vit C-E-cupric-zinc -lutein  Take 1 capsule by mouth 2 (two) times daily. Sarah Montes is a 89 year old female who presents with urinary incontinence.She experiences ongoing urinary incontinence, particularly difficulty holding urine until reaching the bathroom. She describes episodes of urgency where she can count to 'a thousand twenty-one' before leaking, with two episodes of leakage in the evening when her bladder was full. She notes a lack of significant signal to go to the bathroom until it is almost too late and manages her symptoms by going to the bathroom every hour to hour and a half.She has been consistently working on pelvic floor exercises and feels there might be slight improvement in her pelvic floor strength. She incorporates these exercises into her routine, both at the gym and during daily activities.She uses Knicks panties with a pad when going out and tries to avoid  using pads at home. She uses one or two pads when out and a full pad at night. Occasionally, she wakes up during the night after starting to urinate in bed, and sometimes goes through the night without getting up but is wet in the morning.She rates her frequent urination as a three out of four and urine leakage associated with urgency as quite a bit. No leakage related to coughing, sneezing, or laughing, but small amounts of leakage rated as a two out of four. No difficulty emptying her bladder or any pain or discomfort in the lower abdomen or genital region.Chief Complaint:Urinary Urgency IncontinencePertinent History of Current Problem:  The patient presents with chronic bladder issues for evaluation and management. She was referred by Dr. Lincoln Renshaw for evaluation of bladder issues.She has experienced chronic bladder issues for many years, which began after childbirth. She has two children in their fifties and had normal pregnancies but difficult labor and delivery, including episiotomies and the use of low forceps. Previous treatments include bladder sling surgery and a procedure involving electrodes on her ankle (PTNS) to affect bladder nerves, neither of which significantly helped. She has not been able to perform Kegel exercises effectively and has not tried physical therapy for them.She experiences nocturnal enuresis, requiring the use of a pad at night as she does not wake up in time to reach the bathroom before urination begins. This typically occurs once a night, sometimes twice. During the day, she uses a panty liner and changes it about three times, depending on fluid intake and proximity to a bathroom. She urinates about four times a day, despite limited fluid intake, which includes one cup of coffee and minimal water. She tries to drink three glasses of water a day but sometimes gets busy and drinks less.She describes urgency incontinence, where she sometimes does not make it to the bathroom in time, especially if she is distracted or waits too long. She does not experience stress incontinence with physical activities like standing, sitting, bending, or reaching. She has no bladder pain and reports regular bowel movements, although she manages constipation with iron supplements and has stopped prune juice due to adverse effects.Her past surgical history includes a urethrotomy in the 1980s for frequent bladder infections, which have since resolved. She uses estradiol cream twice a week on the urethra, as prescribed by Dr. Lincoln Renshaw, to help with urinary issues. She also has a history of hysterectomy at age 75, with no menopausal symptoms noted.12/15/2023 US  PELVIS COMPLETE:No free fluid seen. Postoperative changes suspected in the ovaries are not visualized. The study is limited on this transabdominal only exam and could be further assessed on MRI if thought clinically indicated. Past Medical HistoryPast Medical History: Diagnosis Date  Abnormal cervical Papanicolaou smear   Glaucoma   Macular degeneration disease  Past Surgical HistoryPast Surgical History: Procedure Laterality Date  BLADDER SUSPENSION    CATARACT EXTRACTION    FINGER SURGERY Left   cyst  HYSTERECTOMY   AllergiesAdhesive, Alendronate, Alendronic acid, Atorvastatin, Penicillins, Phenylalanine, Tramadol , and OxycodonePain Rating:  0 / 10Educational/Vocational History:  She works as a Contractor virtually Social/Sport/Leisure History:   She maintains an active lifestyle, including going to the gym for chair yoga, cycling, and weightlifting. She experienced a fall from a treadmill last year, resulting in stitches, and now avoids treadmills.Prior Level of Function:  chronic   Change in status from prior level of function?  Chronic incontinenceObjectiveInterim/Reassessment/Recert Outcomes Measure completedPelvic HealthHistory   OB/GYN/Pelvic Surgeries:  Partial hysterectomy and POP repairs (  bladder sling)   Obstetric History      Gravida:  3 Para:  2      Miscarriages:  1      Episiotomy:  Yes   Infection history: Lower UTI   Endocrine history: Postmenopausal (add date to comments)  Unsure due to hysterectomyTests/Measures   Sexual Function Assessment      Sexual function:  Not active (husband has passed away)Bladder Function   Bladder Habits      Fluid intake, amounts and types:  Coffee, 6oz water (tries for 3 glasses), seltzer at 5pm (stopped drinking seltzer 4/9)      Pad usage:  Yes      Correct continence pads:  Yes      Number of pads used per day:  Eval : 3 liners + 1 pad, 4/9: none during the day at home, wears 1 liner if going out of house and 1 regular pad at night   Bladder Function      Bladder function:  Impaired      Void frequency (per day): 5-6      Nocturia:  Yes         Number of times:  1-2      Bladder urge:  Present      Initiation:  Present      Stream:  Weak      Sense of fully emptying:  Present      Post void dribble:  Absent      Sense of urgency:  Present      Straining:  Absent      UUI: Yes      Amount:      Frequency: almost daily      SUI: No      HW:EXHBZ Function   Bowel function:  Within normal limits   Straining: Absent      Constipation: Yes   Fecal incontinence: NoPhysical Exam   Posture:  Within  normal limits   Flexibility:     Not assessedPelvic Floor Evaluation   Pelvic exam explained to patient:  Patient defers at this time, option to consent in futureOutcome Measures   Pelvic Floor Distress Inventory (PFDI 20)      Scoring: 0=Not present (never experienced)  1=Not at all (experienced previously)  2=Somewhat   3=Moderately   4=Quite a bit   Pelvic Organ Prolapse Distress Inventory 6 (POPDI-6)      1. Usually experience pressure in lower abdomen?  0       2. Usually experience heaviness/dullness in pelvic area? 0       3. Usually have bulge/something falling out you can see/feel in vaginal area? 0       4. Ever have to push on vagina/around rectum to have complete bowel movement?  0      5. Usually experience feeling of incomplete bladder emptying?  0      6. Ever have to push on bulge in vaginal area with fingers to start/complete urination?  0      TOTAL Score:  0   Colorectal-Anal Distress inventory 8 (CRAD-8)      7. Feel need to strain too hard to have bowel movement?  0      8. Feel you have not completely emptied your bowels at end of bowel movement?  0      9. Usually lose stool beyond your control if your stool is well formed?  0      10. Usually lose stool beyond  your control if stool is loose?  0      11. Usually lose gas from the rectum beyond your control?  0      12. Usually have pain when you pass stool?  0      13. Experience strong sense of urgency/rush to bathroom to have bowel movement?  0      14. Does part of bowel ever pass through rectum/bulge outside during/after bowel movement?  0      TOTAL Score:  0   Urinary Distress Inventory 6 (UDI-6)      15. Usually experience frequent urination?  3      16. Usually experience urine leakage associated with feeling of urgency?  4      17. Usually experience urine leakage related to coughing, sneezing, laughing?  0      18. Usually experience small amounts of urine leakage?  2      19. Usually experience difficulty emptying bladder?  0      20. Usually experience pain/discomfort in lower abdomen/genital region?  0      TOTAL Score:  9Overall Comments   PFDI-20: 12 ptsUDI-6: 10 pts,  May- 9pts5/30/25: Palpation: Seated external / clothed palpation of the left PF in sitting.  Fair - contraction, Minimal/ no gluteal contractions. Quick Flicks x4, Long holds of 5 secs x2 reps                                     Treatment Provided This VisitCPT Code Interventions Timed Minutes Untimed Minutes Total Minutes Therapeutic Activity [phone]) - review of urge delay techniques (paper given) d/t increased leaking while walking to the bathroom and pt reporting rushing to get there in time- continuation of 90 minute voiding time, educated on how to use timer on phone while waiting for voiding watch to come in mail.Listening to her body and neuromuscular re-education to increase bladder pressure awareness    Neuromuscular Re-Education [phone]) Reassessment Discussion of progress and anticipated prognosisSeated PF exercises with various verbal and tactile cuesDiscussion of home management of symptomsIssued bladder log to better understand benefit (or lack of)  with using a timed voiding schedule versus greater awareness of urge. 42  42 N/A     N/A       Total Treatment Time: 42 Problem ListUrge incontinencePelvic floor dysfunctionAssessmentShe reports improvement in pelvic floor control over the past four weeks, with increased ability to hold urine longer before leakage. She experienced two episodes of urgency incontinence when her bladder was full and occasional stress incontinence with sneezing, coughing, and laughing. Consistent with pelvic floor exercises, she reports more control but still experiences nocturnal enuresis when forgetting to void. She is concerned about the cost of Seferino Dade and is considering alternatives. She has not initiated new medication. She uses a gym machine for pelvic floor strengthening and practices exercises in various settings, including at church and before bed.- Continue pelvic floor exercises to strengthen muscles and improve control.- Set an alarm or timer to remind her to void every 90 minutes to prevent bladder overdistension.- Incorporate quick squeezes when feeling the urge to urinate, especially before standing or approaching the toilet.- Monitor fluid intake, aiming for two bottles of water per day, increasing on hot days or during exercise.- Use bladder logs to track progress and identify patterns in urgency and leakage.- Schedule follow-up appointments every two weeks to monitor progress and adjust the plan  as needed.Patient / Family / Caregiver EducationDiscussed role of therapyDiscussed the value of collaboration with other providersDiscussed the presenting problemReviewed the assessmentDiscussed plan of care and rationalePatient/Family/Caregiver demonstrate agreement with the planWritten materials / instruction providedRehab PotentialGoodPlanTherapeutic Exercise (97110)Manual Therapy (97140)Therapeutic Activity (97530)Self-Care/Home Management (97535)Neuromuscular Re-Education (97112)Biofeedback Training (90911)Electrical Stimulation - unattended (97014)Electrical Stimulation - attended (97032)Frequency:  1x per weekDuration:  8 weeksPlan for Next VisitBladder log reviewPF assessment with consentInitial PF HEPRecommendationsnoneGoalsShort Term Goals: (will be met in 5-6 visits)1. Patient will be Independent with the initial HEP to aid in increasing and maintaining gains made between PT sessions. MET 4/92. PERF Testing (pt with right of refusal)3 Pt will report a decrease in night voiding to </= 1 time per night for improved rest.  WORKING TOWARDS4. Pt will report ability to delay voiding for at least 5 mins in order to get from bed to bathroom. 50%5 Patient will increase water intake to a total goal of 32-64  ounces per day for overall wellness and decrease bladder irritation. WORKING TOWARDS6. Patient is able to demonstrate pelvic floor contraction with proper breathing pattern in order to successfully suppress urge to go . MET 4/9Long Term Goals: ( will be met in 10-12 visits )1. Pt will report less than 8 point disability on the UDI-6 for improved subjective ADLs.2. Pt will increase her PF muscle endurance to at least 15 secs to increase ability to hold flow 3. Patient will report increased voiding intervals daytime to at least 2 hours or more evidenced by patient report and/or bladder diary4. Pt will be able to walk 50 % of the time without leakage 5. Pt will increase MMT of pelvic floor strength by at least  1-2 grades from initial assessment  I provided a concise overview of the ambient note generation solution. Pennelope Bowler or their legally authorized representative verbally consented to a temporary audio recording of their visit to assist with completing the visit documentation using an AI-powered solution. This note was reviewed for accuracy by Geraldine Kling, PT who performed the clinical service.

## 2024-03-28 ENCOUNTER — Inpatient Hospital Stay: Admit: 2024-03-28 | Discharge: 2024-03-28 | Payer: PRIVATE HEALTH INSURANCE | Primary: Family

## 2024-03-28 DIAGNOSIS — M6289 Other specified disorders of muscle: Secondary | ICD-10-CM

## 2024-03-28 DIAGNOSIS — N3941 Urge incontinence: Secondary | ICD-10-CM

## 2024-03-28 NOTE — Other
 [facility] REHABILITATION [facility] [place] [address]WUJWJ Number: [phone] Number: [phone] Therapy Pelvic  Daily / Progress NotePatient Name:  Sarah Montes Record Number:  [id number] Date of Birth:  1932-11-04Therapist:  Geraldine Kling, DPTReferring Provider:  Katelyn Kane Johnson, MDICD-10 Diagnosis(es):Problem List       ICD-10-CM   Right Leg and Left Knee  Ulcer of right lower leg, with unspecified severity [facility] Code)  L97.919   PT-UUI-12/22/23  Pelvic floor dysfunction M62.89  Urgency incontinence N39.41 General InformationTherapy Episode of Care   Date of Visit:  03/28/2024    Treatment Number:  8    Date the Treatment Plan was Initiated/Reviewed:  03/15/2024   Start of Care Date:  12/22/2023    Onset of Illness/Injury Date:  11/16/2023    Progress Report Due Date:  04/15/2024 Precautions/Limitations   Precautions/Limitations:  No known precautions/limitationsNew Neurological Deficit   Did the patient have a new neurological deficit as evidenced by diminished muscle strength   of less than 3 at any time during the 90 days after spine surgery:  N/AInterpreter [employer] Utilized?  NoCognition / Learning Assessment   Primary Learner Relationship:  Patient        Barriers to learning:  Visual and Hearing        Preferred language:  English        Preferred learning style:  DemonstrationI reviewed the Patient Care Agreement and Attendance Form with the Patient/Family.  The Patient/Family verbalized understanding.Rehabilitation GroupingsPrimary Group:  Pelvic Health Secondary Group:  Non-surgical - WomenTertiary Group: IncontinenceMedication Review:Current Outpatient Medications Medication Sig  brimonidine  Place 1 drop into both eyes 2 (two) times daily.  calcium  carbonate Take 1 tablet (600 mg total) by mouth daily.  cholecalciferol  (vitamin D3) Take 1 tablet (1,000 Units total) by mouth daily.  dorzolamide -timoloL  Place 1 drop into the left eye 2 (two) times daily.  ketoconazole  daily as needed.  Vyzulta  Place 1 drop into both eyes nightly.  vit C-E-cupric-zinc -lutein  Take 1 capsule by mouth 2 (two) times daily. SubjectiveHistory of Present Sarah Montes is a 89 year old female who presents with urinary incontinence management.She experiences urinary incontinence, which has been particularly challenging over the past week. She attended a conference from Friday to Sunday, sitting in front of a computer for eight hours each day, and worked on Monday, leading to increased fatigue by Tuesday.There is variability in her symptoms, with some days experiencing no urge or leakage until mid-afternoon, while on other days, she struggles to make it to the bathroom in time, especially when further away from it. She has been tracking her symptoms since the conference and finds that proximity to a bathroom helps manage her urgency.She drinks water regularly, consuming one bottle in the morning and another in the afternoon. She can hold her urine for up to thirty seconds, which is an improvement from her previous ten seconds. However, she still experiences leakage, particularly when she is distracted or engrossed in activities.She used to rely on a vibrating reminder device to prompt her to use the bathroom, which she misses as it helped her manage her symptoms better.She experiences leakage at night and sometimes gets up during the night to use the bathroom. In the morning, she goes to the bathroom immediately upon waking, usually around 6:30 AM. She describes her leakage as sometimes occurring without a strong urge to urinate, and she is trying to identify patterns or triggers for her symptoms.She is actively engaging in pelvic floor exercises,  although she finds it challenging to maintain consistency due to her busy schedule. 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 She was referred by Dr. Lincoln Renshaw for evaluation of bladder issues.She has experienced chronic bladder issues for many years, which began after childbirth. She has two children in their fifties and had normal pregnancies but difficult labor and delivery, including episiotomies and the use of low forceps. Previous treatments include bladder sling surgery and a procedure involving electrodes on her ankle (PTNS) to affect bladder nerves, neither of which significantly helped. She has not been able to perform Kegel exercises effectively and has not tried physical therapy for them.She experiences nocturnal enuresis, requiring the use of a pad at night as she does not wake up in time to reach the bathroom before urination begins. This typically occurs once a night, sometimes twice. During the day, she uses a panty liner and changes it about three times, depending on fluid intake and proximity to a bathroom. She urinates about four times a day, despite limited fluid intake, which includes one cup of coffee and minimal water. She tries to drink three glasses of water a day but sometimes gets busy and drinks less.She describes urgency incontinence, where she sometimes does not make it to the bathroom in time, especially if she is distracted or waits too long. She does not experience stress incontinence with physical activities like standing, sitting, bending, or reaching. She has no bladder pain and reports regular bowel movements, although she manages constipation with iron supplements and has stopped prune juice due to adverse effects.Her past surgical history includes a urethrotomy in the 1980s for frequent bladder infections, which have since resolved. She uses estradiol cream twice a week on the urethra, as prescribed by Dr. Lincoln Renshaw, to help with urinary issues. She also has a history of hysterectomy at age 3, with no menopausal symptoms noted.12/15/2023 US  PELVIS COMPLETE:No free fluid seen. Postoperative changes suspected in the ovaries are not visualized. The study is limited on this transabdominal only exam and could be further assessed on MRI if thought clinically indicated. Past Medical HistoryPast Medical History: Diagnosis Date  Abnormal cervical Papanicolaou smear   Glaucoma   Macular degeneration disease  Past Surgical HistoryPast Surgical History: Procedure Laterality Date  BLADDER SUSPENSION    CATARACT EXTRACTION    FINGER SURGERY Left   cyst  HYSTERECTOMY   AllergiesAdhesive, Alendronate, Alendronic acid, Atorvastatin, Penicillins, Phenylalanine, Tramadol , and OxycodonePain Rating:  0 / 10Educational/Vocational History:  She works as a Contractor virtually Social/Sport/Leisure History:   She maintains an active lifestyle, including going to the gym for chair yoga, cycling, and weightlifting. She experienced a fall from a treadmill last year, resulting in stitches, and now avoids treadmills.Prior Level of Function:  chronic   Change in status from prior level of function?  Chronic incontinenceObjectiveOutcomes Measure not required for this visit5/30/25: Palpation: Seated external / clothed palpation of the left PF in sitting.  Fair - contraction, Minimal/ no gluteal contractions. Quick Flicks x4, Long holds of 5 secs x2 reps6/12/25: See Media folder for bladder log  Treatment Provided This VisitCPT Code Interventions Timed Minutes Untimed Minutes Total Minutes Therapeutic Activity [phone]) - review of urge delay techniques (paper given) d/t increased leaking while walking to the bathroom and pt reporting rushing to get there in time- continuation of 90 minute voiding time, educated on how to use timer on phone while waiting for voiding watch to come in mail.Listening to her body and neuromuscular re-education to increase bladder pressure awarenessExternal alerts including resetting her Fitbit alarms for a vibrating alert 30  30 Neuromuscular Re-Education [phone]) Reassessment Discussion of progress and anticipated prognosisSeated PF exercises with various verbal and tactile cuesDiscussion of home management of symptomsIssued bladder log to better understand benefit (or lack of)  with using a timed voiding schedule versus greater awareness of urge.    N/A     N/A       Total Treatment Time: 30 Problem ListUrge incontinencePelvic floor dysfunctionAssessmentUrinary incontinenceUrinary incontinence with variable leakage patterns. Improved ability to hold urine for up to 30 seconds, indicating increased endurance. Difficulty with internal signals for urination, leading to occasional leakage. Possible stress incontinence due to activities like standing or twisting. She uses a Fitbit for reminders to urinate every 1.5 hours to help manage symptoms. Emphasized the importance of recognizing internal signals and responding to them to strengthen the connection between sensation and urination. Encouraged to keep a record of urination patterns to identify triggers and improve management.- Set Fitbit alarms to remind to urinate every 1.5 hours from 9 AM to 7:30 PM daily, including weekends.- Encourage keeping a record of urination patterns and triggers.- Continue pelvic floor exercises to strengthen muscles.- Respond to internal signals for urination to improve awareness and control.Patient / Family / Caregiver EducationDiscussed role of therapyDiscussed the value of collaboration with other providersDiscussed the presenting problemReviewed the assessmentDiscussed plan of care and rationalePatient/Family/Caregiver demonstrate agreement with the planWritten materials / instruction providedRehab PotentialGoodPlanTherapeutic Exercise (97110)Manual Therapy (97140)Therapeutic Activity (97530)Self-Care/Home Management (97535)Neuromuscular Re-Education (97112)Biofeedback Training (90911)Electrical Stimulation - unattended (97014)Electrical Stimulation - attended (97032)Frequency:  1x per weekDuration:  8 weeksPlan for Next VisitBladder log reviewPF assessment with consentInitial PF HEPRecommendationsnoneGoalsShort Term Goals: (will be met in 5-6 visits)1. Patient will be Independent with the initial HEP to aid in increasing and maintaining gains made between PT sessions. MET 4/92. PERF Testing (pt with right of refusal)3 Pt will report a decrease in night voiding to </= 1 time per night for improved rest.  WORKING TOWARDS4. Pt will report ability to delay voiding for at least 5 mins in order to get from bed to bathroom. 50%5 Patient will increase water intake to a total goal of 32-64  ounces per day for overall wellness and decrease bladder irritation. WORKING TOWARDS6. Patient is able to demonstrate pelvic floor contraction with proper breathing pattern in order to successfully suppress urge to go . MET 4/9Long Term Goals: ( will be met in 10-12 visits )1. Pt will report less than 8 point disability on the UDI-6 for improved subjective ADLs.2. Pt will increase her PF muscle endurance to at least 15 secs to increase ability to hold flow 3. Patient will report increased voiding intervals daytime to at least 2 hours or more evidenced by patient report and/or bladder diary4. Pt will be able to walk 50 % of the time without leakage 5. Pt will increase MMT of pelvic floor strength by at least  1-2 grades from initial assessment  I provided a concise overview of the ambient note generation solution. Pennelope Bowler or their legally authorized  representative verbally consented to a temporary audio recording of their visit to assist with completing the visit documentation using an AI-powered solution. This note was reviewed for accuracy by Geraldine Kling, PT who performed the clinical service.

## 2024-03-29 ENCOUNTER — Emergency Department: Admit: 2024-03-29 | Payer: MEDICARE | Primary: Family

## 2024-03-29 ENCOUNTER — Encounter: Admit: 2024-03-29 | Payer: PRIVATE HEALTH INSURANCE | Attending: Emergency Medicine | Primary: Family

## 2024-03-29 ENCOUNTER — Inpatient Hospital Stay: Admit: 2024-03-29 | Discharge: 2024-03-29 | Payer: MEDICARE

## 2024-03-29 DIAGNOSIS — S51811A Laceration without foreign body of right forearm, initial encounter: Secondary | ICD-10-CM

## 2024-03-29 DIAGNOSIS — R0781 Pleurodynia: Secondary | ICD-10-CM

## 2024-03-29 DIAGNOSIS — M25552 Pain in left hip: Secondary | ICD-10-CM

## 2024-03-29 DIAGNOSIS — S0003XA Contusion of scalp, initial encounter: Secondary | ICD-10-CM

## 2024-03-29 DIAGNOSIS — H3530 Unspecified macular degeneration: Secondary | ICD-10-CM

## 2024-03-29 DIAGNOSIS — M545 Low back pain, unspecified: Secondary | ICD-10-CM

## 2024-03-29 DIAGNOSIS — R87619 Unspecified abnormal cytological findings in specimens from cervix uteri: Secondary | ICD-10-CM

## 2024-03-29 DIAGNOSIS — H409 Unspecified glaucoma: Secondary | ICD-10-CM

## 2024-03-29 MED ORDER — LIDOCAINE HCL 10 MG/ML (1 %) INJECTION SOLUTION
10 | Freq: Once | INTRADERMAL | Status: CP
Start: 2024-03-29 — End: ?
  Administered 2024-03-29: 14:00:00 10 mL via INTRADERMAL

## 2024-03-29 MED ORDER — DICLOFENAC EPOLAMINE 1.3 % TRANSDERMAL 12 HOUR PATCH
1.3 | MEDICATED_PATCH | Freq: Two times a day (BID) | TRANSDERMAL | 1 refills | 11.00000 days | Status: AC | PRN
Start: 2024-03-29 — End: ?

## 2024-03-29 MED ORDER — ACETAMINOPHEN 325 MG TABLET
325 | Freq: Once | ORAL | Status: CP
Start: 2024-03-29 — End: ?
  Administered 2024-03-29: 11:00:00 325 mg via ORAL

## 2024-03-29 NOTE — ED Notes
 XR called at this time for add on images.

## 2024-03-29 NOTE — Discharge Instructions
 You were evaluated in the emergency department for injuries after fall downstairs.  Your forearm laceration was approximated with 8 nylon sutures and 3 Steri-Strips.The x-rays of your lumbar spine, left hip, pelvis, and ribs are reassuring.  The [place] scan of your head and C-spine are reassuring.You can apply Flector patches twice a day.Take Extra-strength Tylenol  1 gram every 6 hours for pain.  *Do not take more than 4 grams of Tylenol  per day.  For the next 2 days: gently clean around the wound with soap and water. Dry completely to prevent skin breakdown.Apply a thin layer of bacitracin to the wound twice daily for the next 2 days.Use the incentive spirometer once every 10-15 minutes while awake for the next 2 to 3 days to prevent pneumonia.Suture removal in 7 to 10 days.  The Steri-Strips will peel off as the wound heals.Return to the emergency department if you experience any new or concerning symptoms  (e.g. fevers, chills, pain, redness or swelling of the affected area, draining pus) or if you have any other  acute medical concerns.Follow up with your primary care physician in the next 3 to 4 days.

## 2024-03-29 NOTE — ED Provider Notes
 EMERGENCY MEDICINE APP EVALUATION NOTECHIEF COMPLAINT  Chief Complaint Patient presents with  Fall greater than 89 years old   Pt states she was attempting to throw an air mattress over dog gate and tripped and fell down 4 stairs. Pt complaining of low back pain. Has skin tear on R arm. Pt hit head. No LOC. No thinners.  HISTORY OF PRESENT ILLNESS History of Present Sarah Montes is a 89 year old female, right-hand dominant, who presents with a fall resulting in head and hip pain, and right forearm laceration.She fell while carrying an air mattress down the stairs, losing her balance and landing on the back of her head. She experiences pain in her left hip, specifically in the back, and has a bump on the back of her head. She scraped her arm during the fall.She can walk without assistance and denies pain in her lower leg, numbness, tingling, chest pain, shortness of breath, dizziness, lightheadedness, nausea, vomiting, or vision changes. She has not moved her bowels or bladder since the fall and denies neck or jaw pain. She was with her grandson at the time.MEDICAL DECISION MAKING & ED COURSE  Clinical Impressions as of 03/29/24 1454 Closed head injury, initial encounter Skin tear of right forearm without complication, initial encounter Rib pain on right side Acute low back pain without sciatica, unspecified back pain laterality Left hip pain Fall down stairs, initial encounter During the patient's ED course, the patient was given:Medications lidocaine  10 mg/mL (1 %) injection 5 mL (5 mLs Intradermal Given by Other. 03/29/24 1352) acetaminophen  (TYLENOL ) tablet 650 mg (650 mg Oral Given 03/29/24 1121)  Lac RepairDate/Time: 03/29/2024 2:48 PMPerformed by: Dedra Fantasia, PAAuthorized by: Dedra Fantasia, PA  Time out documented by nurse: yesAnesthesia:   Anesthesia method:  Local infiltration  Local anesthetic: Lidocaine  1% w/o epiLaceration details:   Location:  Shoulder/arm  Shoulder/arm location:  R lower arm  Length (cm):  5  Depth (mm):  4Repair type:   Repair type:  SimplePre-procedure details:   Preparation:  Patient was prepped and draped in usual sterile fashionExploration:   Hemostasis achieved with:  Direct pressure  Wound exploration: wound explored through full range of motion and entire depth of wound probed and visualized    Wound extent: areolar tissue not violated, fascia not violated, no foreign body, no signs of injury, no nerve damage, no tendon damage, no underlying fracture and no vascular damage  Treatment:   Area cleansed with:  Povidone-iodine and sterile water  Amount of cleaning:  Standard  Irrigation solution:  Sterile water  Irrigation volume:  500 cc  Irrigation method:  Pressure wash  Visualized foreign bodies/material removed: no  Skin repair:   Repair method:  Sutures and Steri-Strips  Suture size:  5-0  Suture material:  Nylon  Suture technique:  Simple interrupted  Number of sutures:  8  Number of Steri-Strips:  3Approximation:   Approximation:  Close  Vermilion border: well-aligned  Post-procedure details:   Dressing:  Non-adherent dressing.  Patient tolerance of procedure:  Tolerated well, no immediate complications.  Chaperone present: no chaperone present  Reason chaperone not present: emergent procedure ZOX:WRUEA signs stable.  There is a 2 cm hematoma to the right posterior parietal scalp.  She is neurovascularly intact.  Nonfocal neurologic exam.  There is a full-thickness right forearm laceration into the wrist.  There is no reproducible tenderness the elbow, forearm or wrist.  She has full ROM.  No evidence of open fracture.IMAGING reviewed:[place] head C-spine  NADXR L-spine, left hip and pelvis NADLaceration is approximated good cosmesis and hemostasis.  Patient mentioned soreness to her right lower ribs.  There is minimal tenderness to palpation in the right anterior lower ribs 9th tendon intercostal musculature.  No adventitious breath sounds.  He is able take deep breaths on her own.  No respiratory distress.  No reproducible abdominal tenderness.  No peritoneal signs.XR right ribs, CXR NADPatient declines incentive spirometer.  Patient feels able take deep breaths on her own.Will treat for musculoskeletal pain.  Wound instructions given at discharge.An acute or life threatening problem was considered during this evaluation   The emergency medical evaluation including physical exam findings, data, and plan of care are discussed with the patient.The patient is amenable to the plan as outlined in the discharge instructions.  Return precautions are discussed. Attestation/Critical CareCritical care provided by attending: no critical carePatient progress: improvedVitals:  03/29/24 0938 BP: (!) 160/73 Pulse: 77 Resp: 18 Temp: 97.4 ?F (36.3 ?C) TempSrc: Temporal SpO2: 97% Weight: 53.1 kg PHYSICAL EXAM VITAL SIGNS: ED Triage Vitals [03/29/24 0938]BP: (!) 160/73Pulse: 77Pulse from  O2 sat: n/aResp: 18Temp: 97.4 ?F (36.3 ?C)Temp src: TemporalSpO2: 97 %  GENERAL APPEARANCE:  WNWD female.  Nontoxic. No acute distress.HEAD: Normocephalic.  There is a 2 cm hematoma to the right posterior parietal scalp.  No open wounds.  Negative Battle sign and Raccoon sign.EYES: PERRL.  Pupils 3 mm.  Sclerae anicteric. EOM's grossly intact.ENT:  Tympanic membranes gray.  No hemotympanum.  Moist mucous membranes. Voice is clear. NECK: Supple.  No midline tenderness or pain on axial compression.  Good range of motion.HEART/CHEST: No cyanosis.  RRR.  S1-S2 appreciated. No murmurs, rubs, gallops.LUNGS: Respirations unlabored, normal effort.  CTAB.  No wheezes, crackles, or rhonchi.ABDOMEN:  Nondistended.  Nontender. Normoactive bowel sounds.MUSCULOSKELETAL:  Back is atraumatic.  Extremities are neurovascularly intact. Atraumatic x3.  Right foot reaction.  Full elbow flexion, extension, pronation, supination, full wrist flexion, extension, pronation, supination.SKIN: Wound: 5 centimeter  full-thickness, clean, non contaminated, flap laceration to the right forearm.  NEUROLOGICAL: Alert and oriented x4. CN's 2-12 grossly intact. Sensation proximally distally to the wound is intact.  Moving all extremities. Gait steady. PSYCHIATRIC: Normal mood and affect.RESULTS LABORATORY STUDIES:Results for orders placed or performed during the hospital encounter of 04/26/23 Iron and TIBC  Collection Time: 04/26/23  9:29 AM Result Value Ref Range  Iron 83 50 - 170 ug/dL  TIBC 161 096 - 045 ug/dL  Iron Saturation 28 27 - 40 % Vitamin D, 25-hydroxy  Collection Time: 04/26/23  9:29 AM Result Value Ref Range  Vitamin D 25-Hydroxy Total 70 See Comment ng/mL Lipid panel  Collection Time: 04/26/23  9:29 AM Result Value Ref Range  Cholesterol 235 (H) See Comment mg/dL  HDL 92 (H) See Comment mg/dL  Triglycerides 409 See Comment mg/dL  LDL Calculated 811 (H) See Comment mg/dL CBC auto differential  Collection Time: 04/26/23  9:29 AM Result Value Ref Range  WBC 6.6 4.0 - 11.0 x1000/?L  RBC 3.80 (L) 4.00 - 6.00 M/?L  Hemoglobin 11.9 11.7 - 15.5 g/dL  Hematocrit 91.47 82.95 - 45.00 %  MCV 97.6 80.0 - 100.0 fL  MCH 31.3 27.0 - 33.0 pg  MCHC 32.1 31.0 - 36.0 g/dL  RDW-CV 62.1 30.8 - 65.7 %  Platelets 226 150 - 420 x1000/?L  MPV 10.0 8.0 - 12.0 fL  Neutrophils 37.8 (L) 39.0 - 72.0 %  Lymphocytes 48.8 17.0 - 50.0 %  Monocytes 8.2 4.0 - 12.0 %  Eosinophils  4.2 0.0 - 5.0 %  Basophil 0.8 0.0 - 1.4 %  Immature Granulocytes 0.2 0.0 - 1.0 %  nRBC 0.0 0.0 - 1.0 %  Absolute Lymphocyte Count 3.23 0.60 - 3.70 x 1000/?L  Monocyte Absolute Count 0.54 0.00 - 1.00 x 1000/?L Eosinophil Absolute Count 0.28 0.00 - 1.00 x 1000/?L  Basophil Absolute Count 0.05 0.00 - 1.00 x 1000/?L  Absolute Immature Granulocyte Count 0.01 0.00 - 0.30 x 1000/?L  Absolute nRBC 0.00 0.00 - 1.00 x 1000/?L  ANC (Abs Neutrophil Count) 2.51 2.00 - 7.60 x 1000/?L Comprehensive metabolic panel  Collection Time: 04/26/23  9:29 AM Result Value Ref Range  Sodium 139 136 - 145 mmol/L  Potassium 4.7 3.5 - 5.1 mmol/L  Chloride 105 98 - 107 mmol/L  CO2 30 21 - 32 mmol/L  Anion Gap 4 (L) 5 - 15 mmol/L  Glucose 96 65 - 110 mg/dL  BUN 23 (H) 7 - 18 mg/dL  Creatinine 0.98 1.19 - 1.02 mg/dL  Calcium  9.4 8.5 - 10.1 mg/dL  Total Protein 7.2 6.4 - 8.2 g/dL  Albumin 3.3 (L) 3.4 - 5.0 g/dL  Globulin 3.9 2.5 - 5.0 g/dL  Total Bilirubin 0.3 0.2 - 1.0 mg/dL  Alkaline Phosphatase 147 45 - 117 U/L  Alanine Aminotransferase (ALT) 21 12 - 78 U/L  Aspartate Aminotransferase (AST) 21 15 - 37 U/L  eGFR (Creatinine) 57 (L) >=60 mL/min/1.9m2 Urinalysis-macroscopic w/reflex microscopic  Collection Time: 04/26/23  9:38 AM Result Value Ref Range  Clarity, UA Clear Clear  Color, UA Yellow Yellow, Colorless  Specific Gravity, UA 1.013 1.005 - 1.030  pH, UA 7.0 5.5 - 7.5  Protein, UA Negative Negative, Trace  Glucose, UA Negative Negative  Ketones, UA Negative Negative  Blood, UA Negative Negative  Bilirubin, UA Negative Negative  Leukocytes, UA Negative Negative  Nitrite, UA Negative Negative  Urobilinogen, UA <2.0 <=2.0 mg/dL  IMAGING STUDIES:[place] HEAD WO IV CONTRASTCT CERVICAL SPINE WO IV CONTRASTXR HIP LEFT AP AND LATERALXR LUMBAR SPINE AP AND LATERALXR RIBS RIGHT W PA CHEST CLINICAL IMPRESSION   SNOMED [place](R) 1. Closed head injury, initial encounter  CLOSED INJURY OF HEAD 2. Skin tear of right forearm without complication, initial encounter  TEAR OF SKIN 3. Rib pain on right side  RIB PAIN 4. Acute low back pain without sciatica, unspecified back pain laterality  ACUTE LOW BACK PAIN 5. Left hip pain  PAIN OF HIP REGION 6. Fall down stairs, initial encounter  FALL FROM STAIRS DISPOSITION DischargeFollow-up with:[facility] Department[address]ly [place]  [phone] to If symptoms worsenFurtado, Clarice Croissant, NP[address] [facility] [address] AveWesterly [place] [phone] todaySUPERVISORY NOTE: I evaluated this patient independently.  The supervising physician was available for consultation.DISCLAIMER: This chart was created using M-Modal dictation software.  Efforts were made by me to ensure accuracy, however some errors may be present due to limitations of this technology and occasionally words are not transcribed correctly. Dedra Fantasia, PA06/13/25 1454

## 2024-03-29 NOTE — ED Notes
 2:43 PM Pt decline incentive spirometry at this time, states she will do deep breathing at home.

## 2024-04-11 ENCOUNTER — Ambulatory Visit: Admit: 2024-04-11 | Payer: PRIVATE HEALTH INSURANCE | Primary: Family

## 2024-04-11 DIAGNOSIS — M6289 Other specified disorders of muscle: Principal | ICD-10-CM

## 2024-04-15 ENCOUNTER — Encounter: Admit: 2024-04-15 | Payer: PRIVATE HEALTH INSURANCE | Attending: Family | Primary: Family

## 2024-04-15 DIAGNOSIS — R296 Repeated falls: Secondary | ICD-10-CM

## 2024-04-15 DIAGNOSIS — R2689 Other abnormalities of gait and mobility: Principal | ICD-10-CM

## 2024-05-02 ENCOUNTER — Inpatient Hospital Stay: Admit: 2024-05-02 | Discharge: 2024-05-02 | Payer: PRIVATE HEALTH INSURANCE | Primary: Family

## 2024-05-02 DIAGNOSIS — N3941 Urge incontinence: Secondary | ICD-10-CM

## 2024-05-02 DIAGNOSIS — M6289 Other specified disorders of muscle: Principal | ICD-10-CM

## 2024-05-02 NOTE — Other
 [facility] REHABILITATION [facility] [place] [address] Number: [phone] Number: [id number] Therapy Pelvic  Discharge NotePatient Name:  Sarah Montes Record Number:  [id number] Date of Birth:  1932-07-03Therapist:  Dawna Grayer, DPTReferring Provider:  Katelyn Kane Johnson, MDICD-10 Diagnosis(es):Problem List       ICD-10-CM   Right Leg and Left Knee  Ulcer of right lower leg, with unspecified severity [facility] Code)  L97.919   PT-UUI-12/22/23  Pelvic floor dysfunction M62.89  Urgency incontinence N39.41 General InformationTherapy Episode of Care   Date of Visit:  05/02/2024    Treatment Number:  9    Date the Treatment Plan was Initiated/Reviewed:  05/02/2024   Start of Care Date:  12/22/2023    Onset of Illness/Injury Date:  11/16/2023    Progress Report Due Date:  06/02/2024 Precautions/Limitations   Precautions/Limitations:  No known precautions/limitationsNew Neurological Deficit   Did the patient have a new neurological deficit as evidenced by diminished muscle strength   of less than 3 at any time during the 90 days after spine surgery:  N/AInterpreter [employer] Utilized?  NoCognition / Learning Assessment   Primary Learner Relationship:  Patient        Barriers to learning:  Visual and Hearing        Preferred language:  English        Preferred learning style:  DemonstrationI reviewed the Patient Care Agreement and Attendance Form with the Patient/Family.  The Patient/Family verbalized understanding.Rehabilitation GroupingsPrimary Group:  Pelvic Health Secondary Group:  Non-surgical - WomenTertiary Group: IncontinenceMedication Review:Current Outpatient Medications Medication Sig  brimonidine  Place 1 drop into both eyes 2 (two) times daily.  calcium  carbonate Take 1 tablet (600 mg total) by mouth daily.  cholecalciferol  (vitamin D3) Take 1 tablet (1,000 Units total) by mouth daily.  diclofenac  epolamine Place 1 patch over 12 hours onto the skin every 12 (twelve) hours as needed for pain.  dorzolamide -timoloL  Place 1 drop into the left eye 2 (two) times daily.  ketoconazole  daily as needed.  Vyzulta  Place 1 drop into both eyes nightly.  vit C-E-cupric-zinc -lutein  Take 1 capsule by mouth 2 (two) times daily. SubjectiveHistory of Present Sarah Montes is a 89 year old female who presents with persistent urinary leakage due to urgency incontinence.Urgency urinary incontinence- Persistent urinary leakage primarily due to urgency incontinence- Inability to reach the bathroom in time, especially during outings- No stress incontinence; leakage not triggered by physical activity, sneezing, coughing, or lifting objects- Sudden urge to urinate leads to frequent accidents- Occasionally experiences a moderate urge, but not consistently- Does not receive early bladder signals until bladder is at full capacityResponse to conservative management- Voiding schedule and pelvic floor strengthening exercises attempted but incompatible with her lifestyle- Previously managed symptoms on a regimented schedule, but unable to sustain this with daily routine- External alarms and reminders unsuccessful due to active lifestyle- Difficulty with consistent pelvic muscle contraction and uncertainty regarding techniquePharmacologic management- Previously trialed medications for urinary incontinence but not currently using any- Received samples of gemtestin from a urologist but has not initiated therapyChief Complaint:Urinary Urgency IncontinencePertinent History of Current Problem:  The patient presents with chronic bladder issues for evaluation and management. She was referred by Dr. Vicci for evaluation of bladder issues.She has experienced chronic bladder issues for many years, which began after childbirth. She has two children in their fifties and had normal pregnancies but difficult labor and delivery, including episiotomies and the use of low forceps. Previous treatments include bladder sling surgery and  a procedure involving electrodes on her ankle (PTNS) to affect bladder nerves, neither of which significantly helped. She has not been able to perform Kegel exercises effectively and has not tried physical therapy for them.She experiences nocturnal enuresis, requiring the use of a pad at night as she does not wake up in time to reach the bathroom before urination begins. This typically occurs once a night, sometimes twice. During the day, she uses a panty liner and changes it about three times, depending on fluid intake and proximity to a bathroom. She urinates about four times a day, despite limited fluid intake, which includes one cup of coffee and minimal water. She tries to drink three glasses of water a day but sometimes gets busy and drinks less.She describes urgency incontinence, where she sometimes does not make it to the bathroom in time, especially if she is distracted or waits too long. She does not experience stress incontinence with physical activities like standing, sitting, bending, or reaching. She has no bladder pain and reports regular bowel movements, although she manages constipation with iron supplements and has stopped prune juice due to adverse effects.Her past surgical history includes a urethrotomy in the 1980s for frequent bladder infections, which have since resolved. She uses estradiol cream twice a week on the urethra, as prescribed by Dr. Vicci, to help with urinary issues. She also has a history of hysterectomy at age 54, with no menopausal symptoms noted.12/15/2023 US  PELVIS COMPLETE:No free fluid seen. Postoperative changes suspected in the ovaries are not visualized. The study is limited on this transabdominal only exam and could be further assessed on MRI if thought clinically indicated. Past Medical HistoryPast Medical History: Diagnosis Date  Abnormal cervical Papanicolaou smear   Glaucoma   Macular degeneration disease  Past Surgical HistoryPast Surgical History: Procedure Laterality Date  BLADDER SUSPENSION    CATARACT EXTRACTION    FINGER SURGERY Left   cyst  HYSTERECTOMY   AllergiesAdhesive, Alendronate, Alendronic acid, Atorvastatin, Penicillins, Phenylalanine, Tramadol , and OxycodonePain Rating:  0 / 10Educational/Vocational History:  She works as a Contractor virtually Social/Sport/Leisure History:   She maintains an active lifestyle, including going to the gym for chair yoga, cycling, and weightlifting. She experienced a fall from a treadmill last year, resulting in stitches, and now avoids treadmills.Prior Level of Function:  chronic   Change in status from prior level of function?  Chronic incontinenceObjectiveDischarge Outcomes Measure completedPelvic HealthHistory   OB/GYN/Pelvic Surgeries:  Partial hysterectomy and POP repairs (bladder sling)   Obstetric History      Gravida:  3      Para:  2      Miscarriages:  1      Episiotomy:  Yes   Infection history: Lower UTI   Endocrine history: Postmenopausal (add date to comments)  Unsure due to hysterectomyTests/Measures   Sexual Function Assessment      Sexual function:  Not active (husband has passed away)Bladder Function   Bladder Habits      Fluid intake, amounts and types:  Coffee, 6oz water (tries for 3 glasses), seltzer at 5pm (stopped drinking seltzer 4/9)      Pad usage:  Yes      Correct continence pads:  Yes      Number of pads used per day:  Eval : 3 liners + 1 pad, 4/9: none during the day at home, wears 1 liner if going out of house and 1 regular pad at night   Bladder Function Bladder function:  Impaired  Void frequency (per day): 5-6      Nocturia:  Yes         Number of times:  1-2      Bladder urge:  Present      Initiation:  Present      Stream:  Weak      Sense of fully emptying:  Present      Post void dribble:  Absent      Sense of urgency:  Present      Straining:  Absent      UUI: Yes      Amount:      Frequency: almost daily      SUI: No      LP:Sarah Montes Function   Bowel function:  Within normal limits   Straining: Absent      Constipation: Yes   Fecal incontinence: NoPhysical Exam   Posture:  Within  normal limits   Flexibility:     Not assessedPelvic Floor Evaluation   Pelvic exam explained to patient:  Patient defers at this time, option to consent in futureOutcome Measures   Pelvic Floor Distress Inventory (PFDI 20)      Scoring: 0=Not present (never experienced)  1=Not at all (experienced previously)  2=Somewhat   3=Moderately   4=Quite a bit   Pelvic Organ Prolapse Distress Inventory 6 (POPDI-6)      1. Usually experience pressure in lower abdomen?  0       2. Usually experience heaviness/dullness in pelvic area? 0       3. Usually have bulge/something falling out you can see/feel in vaginal area? 0       4. Ever have to push on vagina/around rectum to have complete bowel movement?  0      5. Usually experience feeling of incomplete bladder emptying?  0      6. Ever have to push on bulge in vaginal area with fingers to start/complete urination?  0      TOTAL Score:  0   Colorectal-Anal Distress inventory 8 (CRAD-8)      7. Feel need to strain too hard to have bowel movement?  0      8. Feel you have not completely emptied your bowels at end of bowel movement?  0      9. Usually lose stool beyond your control if your stool is well formed?  0      10. Usually lose stool beyond your control if stool is loose?  0      11. Usually lose gas from the rectum beyond your control?  0      12. Usually have pain when you pass stool?  0      13. Experience strong sense of urgency/rush to bathroom to have bowel movement?  0      14. Does part of bowel ever pass through rectum/bulge outside during/after bowel movement?  0      TOTAL Score:  0   Urinary Distress Inventory 6 (UDI-6)      15. Usually experience frequent urination?  3      16. Usually experience urine leakage associated with feeling of urgency?  3      17. Usually experience urine leakage related to coughing, sneezing, laughing?  0      18. Usually experience small amounts of urine leakage?  3      19. Usually experience difficulty emptying bladder?  0      20. Usually experience pain/discomfort in  lower abdomen/genital region?  0      TOTAL Score:  38   SUMMARY SCORE:  38Overall Comments   PFDI-20: Eval: 12 pts  Discharge: 9 ptsUDI-6: Eval 10 pts,  Discharge- 9pts5/30/25: Palpation: Seated external / clothed palpation of the left PF in sitting.  Fair - contraction, Minimal/ no gluteal contractions. Quick Flicks x4, Long holds of 5 secs x2 reps6/12/25: See Media folder for bladder log                                     Treatment Provided This VisitCPT Code Interventions Timed Minutes Untimed Minutes Total Minutes Therapeutic Activity (97530) Reassessment Final Discharge instructions included:- review of urge delay techniques (paper given) d/t increased leaking while walking to the bathroom and pt reporting rushing to get there in time- continuation of 90 minute voiding time as able to integrate into lifestyleListening to her body and neuromuscular re-education to increase bladder pressure awarenessExternal alerts including resetting her Fitbit alarms for a vibrating alertEducation and handouts on options for a home biofeedback device 45  45 Neuromuscular Re-Education [phone])     N/A     N/A       Total Treatment Time: 45 Problem ListUrge incontinencePelvic floor dysfunctionAssessmentBarbara S Montes has attended 9 sessions of PT for complaint of urge incontinence. Sarah Montes has experienced urge incontinence for many years, likely exacerbated by childbirth, episiotomies, and a urethrotomy. She reports some progress in the last few months, but has not met her personal goals. Sarah Montes reports a busy lifestyle with fair to poor compliance with regular home exercise or implementation of a timed voiding program.  Overall she has chronic urge incontinence with moderate improvement from muscle strengthening exercises, insufficient for lifestyle needs. No stress incontinence.  Conservative management with voiding schedules and muscle strengthening has been ineffective. Difficulty contracting pelvic muscles and lack of success with previous pharmaceutical interventions reported.- Encourage use of moderate urge as a cue to void immediately to prevent leakage.- Consider a home biofeedback devices for pelvic muscle strengthening.- Discuss potential pharmaceutical options with urology.- Discharge from current care with option to return for further hands-on therapy if desired.Patient / Family / Caregiver EducationDiscussed role of therapyDiscussed the value of collaboration with other providersDiscussed the presenting problemReviewed the assessmentDiscussed plan of care and rationalePatient/Family/Caregiver demonstrate agreement with the planWritten materials / instruction providedRehab PotentialGoodPlanDischarge from PT RecommendationsnoneGoalsShort Term Goals: (will be met in 5-6 visits)1. Patient will be Independent with the initial HEP to aid in increasing and maintaining gains made between PT sessions. MET 4/92. PERF Testing (pt with right of refusal)- pt declined3 Pt will report a decrease in night voiding to </= 1 time per night for improved rest. - Not met4. Pt will report ability to delay voiding for at least 5 mins in order to get from bed to bathroom. 50%5 Patient will increase water intake to a total goal of 32-64  ounces per day for overall wellness and decrease bladder irritation. MET 7/17/256. Patient is able to demonstrate pelvic floor contraction with proper breathing pattern in order to successfully suppress urge to go . MET 4/9Long Term Goals: ( will be met in 10-12 visits ) -GOALS NOT MET1. Pt will report less than 8 point disability on the UDI-6 for improved subjective ADLs.2. Pt will increase her PF muscle endurance to at least 15 secs to increase ability to hold flow 3. Patient will report increased voiding intervals daytime  to at least 2 hours or more evidenced by patient report and/or bladder diary4. Pt will be able to walk 50 % of the time without leakage 5. Pt will increase MMT of pelvic floor strength by at least  1-2 grades from initial assessment  I provided a concise overview of the ambient note generation solution. [employer] or their legally authorized representative verbally consented to a temporary audio recording of their visit to assist with completing the visit documentation using an AI-powered solution. This note was reviewed for accuracy by Dawna Grayer, PT who performed the clinical service.

## 2024-06-01 MED ORDER — FLUCONAZOLE 150 MG TABLET
150 | ORAL_TABLET | ORAL | 1 refills | 3.00000 days | Status: AC
Start: 2024-06-01 — End: ?

## 2024-06-03 MED ORDER — ESTRADIOL 0.01% (0.1 MG/GRAM) VAGINAL CREAM
0.01 | 3.00 refills | 42.00000 days | Status: AC
Start: 2024-06-03 — End: ?

## 2024-06-03 MED ORDER — DOXYCYCLINE HYCLATE 100 MG CAPSULE
100 | Freq: Two times a day (BID) | ORAL | 1.00 refills | 7.00000 days | Status: AC
Start: 2024-06-03 — End: ?

## 2024-06-04 MED ORDER — CLOTRIMAZOLE-BETAMETHASONE 1 %-0.05 % TOPICAL CREAM
1-0.05 | Freq: Every day | TOPICAL | 2 refills | 23.00000 days | Status: AC
Start: 2024-06-04 — End: ?

## 2024-06-04 MED ORDER — VALACYCLOVIR 500 MG TABLET
500 | ORAL | 1.00 refills | 10.00000 days | Status: AC
Start: 2024-06-04 — End: ?

## 2024-06-14 ENCOUNTER — Inpatient Hospital Stay: Admit: 2024-06-14 | Discharge: 2024-06-14 | Payer: MEDICARE | Primary: Family

## 2024-06-14 ENCOUNTER — Encounter: Admit: 2024-06-14 | Payer: PRIVATE HEALTH INSURANCE | Attending: Family | Primary: Family

## 2024-06-14 DIAGNOSIS — E559 Vitamin D deficiency, unspecified: Secondary | ICD-10-CM

## 2024-06-14 DIAGNOSIS — Z0000 Encounter for general adult medical examination without abnormal findings: Principal | ICD-10-CM

## 2024-06-14 DIAGNOSIS — Z0001 Encounter for general adult medical examination with abnormal findings: Secondary | ICD-10-CM

## 2024-06-14 LAB — CBC WITH AUTO DIFFERENTIAL
BKR WAM ABSOLUTE IMMATURE GRANULOCYTES.: 0 x 1000/ÂµL (ref 0.00–0.30)
BKR WAM ABSOLUTE LYMPHOCYTE COUNT.: 2.97 x 1000/ÂµL (ref 0.60–3.70)
BKR WAM ABSOLUTE NRBC: 0 x 1000/ÂµL (ref 0.00–1.00)
BKR WAM ANC (ABSOLUTE NEUTROPHIL COUNT): 2.33 x 1000/ÂµL (ref 2.00–7.60)
BKR WAM BASOPHIL ABSOLUTE COUNT.: 0.04 x 1000/ÂµL (ref 0.00–1.00)
BKR WAM BASOPHILS: 0.7 % (ref 0.0–1.4)
BKR WAM EOSINOPHIL ABSOLUTE COUNT.: 0.21 x 1000/ÂµL (ref 0.00–1.00)
BKR WAM EOSINOPHILS: 3.5 % (ref 0.0–5.0)
BKR WAM HEMATOCRIT: 38.4 % (ref 35.00–45.00)
BKR WAM HEMOGLOBIN: 12.4 g/dL (ref 11.7–15.5)
BKR WAM IMMATURE GRANULOCYTES: 0 % (ref 0.0–1.0)
BKR WAM LYMPHOCYTES: 48.9 % (ref 17.0–50.0)
BKR WAM MCH: 32.3 pg (ref 27.0–33.0)
BKR WAM MCHC: 32.3 g/dL (ref 31.0–36.0)
BKR WAM MCV: 100 fL (ref 80.0–100.0)
BKR WAM MONOCYTE ABSOLUTE COUNT.: 0.52 x 1000/ÂµL (ref 0.00–1.00)
BKR WAM MONOCYTES: 8.6 % (ref 4.0–12.0)
BKR WAM MPV: 10.5 fL (ref 8.0–12.0)
BKR WAM NEUTROPHILS: 38.3 % — ABNORMAL LOW (ref 39.0–72.0)
BKR WAM NUCLEATED RED BLOOD CELLS: 0 % (ref 0.0–1.0)
BKR WAM PLATELETS: 181 x1000/ÂµL (ref 150–420)
BKR WAM RDW-CV: 13.1 % (ref 11.0–15.0)
BKR WAM RED BLOOD CELL COUNT.: 3.84 M/ÂµL — ABNORMAL LOW (ref 4.00–6.00)
BKR WAM WHITE BLOOD CELL COUNT: 6.1 x1000/ÂµL (ref 4.0–11.0)

## 2024-06-14 LAB — LIPID PANEL
BKR CHOLESTEROL: 221 mg/dL — ABNORMAL HIGH
BKR HDL CHOLESTEROL: 102 mg/dL — ABNORMAL HIGH
BKR LDL CHOLESTEROL SAMPSON CALCULATED: 104 mg/dL — ABNORMAL HIGH
BKR TRIGLYCERIDES: 86 mg/dL

## 2024-06-14 LAB — COMPREHENSIVE METABOLIC PANEL
BKR ALANINE AMINOTRANSFERASE (ALT): 27 U/L (ref 12–78)
BKR ALBUMIN: 3.6 g/dL (ref 3.4–5.0)
BKR ALKALINE PHOSPHATASE: 102 U/L (ref 45–117)
BKR ANION GAP (LM): 7 mmol/L (ref 5–15)
BKR ASPARTATE AMINOTRANSFERASE (AST): 26 U/L (ref 15–37)
BKR BILIRUBIN TOTAL: 0.5 mg/dL (ref 0.2–1.0)
BKR BLOOD UREA NITROGEN: 30 mg/dL — ABNORMAL HIGH (ref 7–18)
BKR CALCIUM: 9.2 mg/dL (ref 8.5–10.1)
BKR CHLORIDE: 108 mmol/L — ABNORMAL HIGH (ref 98–107)
BKR CO2: 25 mmol/L (ref 21–32)
BKR CREATININE: 1.15 mg/dL — ABNORMAL HIGH (ref 0.55–1.02)
BKR EGFR, CREATININE (CKD-EPI 2021): 45 mL/min/1.73m2 — ABNORMAL LOW (ref >=60)
BKR GLOBULIN: 3.7 g/dL (ref 2.5–5.0)
BKR GLUCOSE: 91 mg/dL (ref 65–110)
BKR POTASSIUM: 4.4 mmol/L (ref 3.5–5.1)
BKR PROTEIN TOTAL: 7.3 g/dL (ref 6.4–8.2)
BKR SODIUM: 140 mmol/L (ref 136–145)

## 2024-06-14 LAB — IRON AND TIBC
BKR IRON SATURATION (SCCCT BH GH LM): 33 % (ref 27–40)
BKR IRON: 112 ug/dL (ref 50–170)
BKR TOTAL IRON BINDING CAPACITY (SCCCT  BH GH LM): 339 ug/dL (ref 250–450)

## 2024-06-14 LAB — TSH: BKR THYROID STIMULATING HORMONE (LM): 4.29 u[IU]/mL — ABNORMAL HIGH (ref 0.36–3.74)

## 2024-06-14 LAB — FERRITIN: BKR FERRITIN: 107 ng/mL (ref 10–291)

## 2024-06-14 LAB — T4, FREE: BKR FREE T4: 0.9 ng/dL (ref 0.76–1.46)

## 2024-06-22 LAB — VITAMIN D, 1,25 DIHYDROXY LC/MS/MS: 1,25-DIHYDROXYVITAMIN D, S: 22 pg/mL (ref 18–78)

## 2024-06-24 ENCOUNTER — Inpatient Hospital Stay: Admit: 2024-06-24 | Discharge: 2024-06-24 | Payer: MEDICARE | Primary: Family

## 2024-06-24 ENCOUNTER — Encounter: Admit: 2024-06-24 | Payer: PRIVATE HEALTH INSURANCE | Attending: Family | Primary: Family

## 2024-06-24 DIAGNOSIS — N1831 Chronic kidney disease (CKD) stage G3a/A1, moderately decreased glomerular filtration rate (GFR) between 45-59 mL/min/1.73 square meter and albuminuria creatinine ratio less than 30 mg/g (H* (HC CODE): Principal | ICD-10-CM

## 2024-06-24 LAB — COMPREHENSIVE METABOLIC PANEL
BKR ALANINE AMINOTRANSFERASE (ALT): 24 U/L (ref 12–78)
BKR ALBUMIN: 3.6 g/dL (ref 3.4–5.0)
BKR ALKALINE PHOSPHATASE: 106 U/L (ref 45–117)
BKR ANION GAP (LM): 6 mmol/L (ref 5–15)
BKR ASPARTATE AMINOTRANSFERASE (AST): 23 U/L (ref 15–37)
BKR BILIRUBIN TOTAL: 0.2 mg/dL (ref 0.2–1.0)
BKR BLOOD UREA NITROGEN: 24 mg/dL — ABNORMAL HIGH (ref 7–18)
BKR CALCIUM: 9.1 mg/dL (ref 8.5–10.1)
BKR CHLORIDE: 102 mmol/L (ref 98–107)
BKR CO2: 28 mmol/L (ref 21–32)
BKR CREATININE DELTA: -0.23
BKR CREATININE: 0.92 mg/dL (ref 0.55–1.02)
BKR EGFR, CREATININE (CKD-EPI 2021): 59 mL/min/1.73m2 — ABNORMAL LOW (ref >=60)
BKR GLOBULIN: 3.7 g/dL (ref 2.5–5.0)
BKR GLUCOSE: 125 mg/dL — ABNORMAL HIGH (ref 65–110)
BKR POTASSIUM: 4.2 mmol/L (ref 3.5–5.1)
BKR PROTEIN TOTAL: 7.3 g/dL (ref 6.4–8.2)
BKR SODIUM: 136 mmol/L (ref 136–145)

## 2024-06-24 LAB — ALBUMIN/CREATININE PANEL, URINE, RANDOM
BKR ALBUMIN, URINE, RANDOM: 5.9 mg/L (ref <30.0)
BKR CREATININE, URINE, RANDOM: 30 mg/dL
BKR MICROALBUMIN/CREATININE RATIO, URINE, RANDOM (LM): 19.6 mg/g{creat} (ref <30.0)

## 2024-10-14 ENCOUNTER — Encounter: Admit: 2024-10-14 | Payer: PRIVATE HEALTH INSURANCE | Attending: Vascular and Interventional Radiology | Primary: Family

## 2024-10-22 ENCOUNTER — Ambulatory Visit: Admit: 2024-10-22 | Payer: PRIVATE HEALTH INSURANCE | Primary: Family

## 2024-10-22 ENCOUNTER — Encounter: Admit: 2024-10-22 | Payer: PRIVATE HEALTH INSURANCE | Primary: Family

## 2024-10-22 VITALS — BP 116/72 | HR 69 | Ht 63.0 in | Wt 125.0 lb

## 2024-10-22 DIAGNOSIS — H3530 Unspecified macular degeneration: Secondary | ICD-10-CM

## 2024-10-22 DIAGNOSIS — R87619 Unspecified abnormal cytological findings in specimens from cervix uteri: Principal | ICD-10-CM

## 2024-10-22 DIAGNOSIS — H409 Unspecified glaucoma: Secondary | ICD-10-CM

## 2024-10-22 DIAGNOSIS — K219 Gastro-esophageal reflux disease without esophagitis: Principal | ICD-10-CM

## 2024-10-22 MED ORDER — FAMOTIDINE 20 MG TABLET
20 | ORAL_TABLET | Freq: Every evening | ORAL | 4 refills | 30.00000 days | Status: AC
Start: 2024-10-22 — End: ?

## 2024-10-22 MED ORDER — GEMTESA 75 MG TABLET
75 | ORAL | 5.00 refills | 30.00000 days | Status: AC
Start: 2024-10-22 — End: ?

## 2024-10-22 NOTE — Progress Notes [1]
 Gastroenterology Office VisitCC: New Patient (Digestive issues, pain to swallow)HPI: This is a 89 y.o. female with significant past medical history of OSA (on CPAP), hyperlipidemia, osteoarthritis who presents for evaluation of new-onset heartburn.Patient reports her heartburn began on September 27, 2024, described as a burning sensation during eating, very sour taste with burping, and decreased appetite. Initial symptoms were severe, with significant loss of appetite, prompting a visit to her primary care provider. Mylanta provided symptom relief, and over several weeks, symptoms gradually improved.Currently, she experiences mild epigastric fullness and occasional light burning, typically relieved by Tums or burping. Heartburn is most noticeable in the morning upon waking, but she is able to eat breakfast without difficulty. Denies dysphagia, odynophagia, nausea, or vomiting. Frequency of heartburn is variable and not tracked unless bothersome. She recalls heartburn after eating chocolate cookie and hamburger with tomatoes last night, but does not remember if it was bothersome.Dietary modifications include stopping coffee, avoiding tomatoes, orange juice, and spicy foods, and eating more bland foods such as cereal, oatmeal, apples, and blueberries. No prior history of heartburn before this episode.Mylanta was used regularly during the acute phase and discontinued as symptoms improved; currently uses Tums as needed.Bowel movements are daily and formed. No blood in stool, bloating, or unintentional weight loss.  Last colonoscopy was performed at age 63. She saw a gastroenterologist 8-10 years ago for diarrhea, which resolved after eliminating wheat and dairy. No history of endoscopy.Review of Systems Constitutional:  Negative for appetite change, chills, fatigue and fever. HENT:  Negative for trouble swallowing.  Respiratory:  Negative for chest tightness and shortness of breath. Cardiovascular:  Negative for chest pain and palpitations. Gastrointestinal:  Negative for abdominal distention, abdominal pain, anal bleeding, blood in stool, constipation, diarrhea, nausea and vomiting. Skin:  Negative for color change and wound. Neurological:  Negative for weakness. Psychiatric/Behavioral:  Negative for confusion and sleep disturbance.   Medical History: Sarah Montes has a past medical history of Abnormal cervical Papanicolaou smear, Glaucoma, and Macular degeneration disease.Surgical History: She  has a past surgical history that includes Hysterectomy; Cataract extraction; Bladder suspension; and Finger surgery (Left).Family History: The family history includes Breast cancer in her maternal aunt and mother; Ovarian cancer in her maternal grandmother.Social History: Ms. Lippold  reports that she has never smoked. She does not have any smokeless tobacco history on file. She reports current alcohol use.Medications: Current Outpatient Medications on File Prior to Visit Medication Sig  brimonidine  (ALPHAGAN ) 0.2 % ophthalmic solution Place 1 drop into both eyes 2 (two) times daily.  calcium  carbonate (CALTRATE 600) 1500 mg (600 mg calcium ) tablet Take 1 tablet (600 mg total) by mouth daily.  dorzolamide -timoloL  (COSOPT ) 22.3-6.8 mg/mL ophthalmic solution Place 1 drop into the left eye 2 (two) times daily.  estradioL  (ESTRACE ) 0.01 % vaginal cream USE 1 GRAM VAGINALLY TWICE A WEEK APPLY DIRECTLY TO YOUR URETHRA TWICE WEEKLY BEFORE BEDTIME.  fluconazole  (DIFLUCAN ) 150 mg tablet Take one pill now and repeat in three days  GEMTESA  75 mg Tab 75 mg orally every day  latanoprostene bunod  (VYZULTA ) 0.024 % ophthalmic solution Place 1 drop into both eyes nightly.  valACYclovir  (VALTREX ) 500 mg tablet Oral; Duration: 10 Days  vit C-E-cupric-zinc -lutein  226-90-0.8-5 mg Cap Take 1 capsule by mouth 2 (two) times daily.  cholecalciferol , vitamin D3, 25 mcg (1,000 unit) tablet Take 1 tablet (1,000 Units total) by mouth daily. (Patient not taking: Reported on 10/22/2024)  clotrimazole -betamethasone  (LOTRISONE ) 1-0.05 % cream Apply topically daily. (Patient not taking: Reported on 10/22/2024)  diclofenac   epolamine (FLECTOR ) 1.3 % transdermal patch 12 hr Place 1 patch over 12 hours onto the skin every 12 (twelve) hours as needed for pain. (Patient not taking: Reported on 10/22/2024)  doxycycline  hyclate (VIBRAMYCIN ) 100 mg capsule Take 1 capsule (100 mg total) by mouth every 12 (twelve) hours. (Patient not taking: Reported on 10/22/2024)  ketoconazole  (NIZORAL ) 2 % cream daily as needed. (Patient not taking: Reported on 10/22/2024) Allergies: Allergies[1]Vitals: BP 116/72 (Site: l a, Position: Sitting)  - Pulse 69  - Ht 5' 3 (1.6 m)  - Wt 56.7 kg  - SpO2 99%  - BMI 22.14 kg/m? Physical Exam Constitutional: NAD, sitting comfortably in the chairENT: mucous membranes moistEyes: anicteric sclera, EOMICardio: regular rate and rhythm, no murmur, gallops or rubResp: CTA b/l, no wheezes, rales or rhonchi GI: Bowel sounds present, soft, NT/ND, no masses MSK: no joint swelling or deformity, normal ROMNeuro: alert and oriented x 3, no focal neuro deficits, Skin: warm and dry, no spider angiomata, no jaundice Data Review: Complete Blood Count BMP Lab Results Component Value Date  WBC 6.1 06/14/2024  RBC 3.84 (L) 06/14/2024  HGB 12.4 06/14/2024  HCT 38.40 06/14/2024  MCV 100.0 06/14/2024  MCH 32.3 06/14/2024  MCHC 32.3 06/14/2024  PLT 181 06/14/2024  MPV 10.5 06/14/2024  Lab Results Component Value Date  NA 136 06/24/2024  K 4.2 06/24/2024  CL 102 06/24/2024  CO2 28 06/24/2024  CREATININE 0.92 06/24/2024  GLU 125 (H) 06/24/2024  CALCIUM  9.1 06/24/2024  PHOS 4.1 06/11/2022   Hepatic Function Profile    Lab Results Component Value Date  ALT 24 06/24/2024  AST 23 06/24/2024  GGT 44 06/10/2022 ALKPHOS 106 06/24/2024  BILITOT 0.2 06/24/2024   Assessment and Plan 1. Gastroesophageal reflux disease, unspecified whether esophagitis present- FL Esophagram complete; Future89 y.o. female with past medical history as above who presents for GERD symptoms.Gastroesophageal reflux diseaseAcute flare improving with conservative management. No alarm features or severe disease.- Provided dietary counseling to avoid acidic and spicy foods, tomatoes, coffee, chocolate, and citrus; recommended a bland diet until symptom resolution.- Advised elevating the head of the bed by 6 inches and waiting 2-3 hours after eating before lying down.- Prescribed famotidine  nightly for 2-3 weeks and as needed for symptoms.- Ordered esophagram to evaluate for structural abnormalities- Scheduled follow-up in 3 months.- Instructed to contact clinic if heartburn persists daily after 3 weeks of famotidine ; will consider initiating proton pump inhibitor for 12 weeks if symptoms do not improve. Medication List    Accurate as of October 22, 2024 12:55 PM. If you have any questions, ask your nurse or doctor.    START taking these medications  famotidine  20 mg tabletCommonly known as: PEPCIDTake 1 tablet (20 mg total) by mouth nightly.Started by: Nat Sers, APRN  CONTINUE taking these medications  brimonidine  0.2 % ophthalmic solutionCommonly known as: ALPHAGAN  calcium  carbonate 1500 mg (600 mg calcium ) tabletCommonly known as: CALTRATE 600 cholecalciferol  (vitamin D3) 25 mcg (1,000 unit) tablet clotrimazole -betamethasone  1-0.05 % creamCommonly known as: LOTRISONEApply topically daily. diclofenac  epolamine 1.3 % transdermal patch 12 hrCommonly known as: FLECTORPlace 1 patch over 12 hours onto the skin every 12 (twelve) hours as needed for pain. dorzolamide -timoloL  22.3-6.8 mg/mL ophthalmic solutionCommonly known as: COSOPT  doxycycline  hyclate 100 mg capsuleCommonly known as: VIBRAMYCIN  estradioL  0.01 % vaginal creamCommonly known as: ESTRACE  fluconazole  150 mg tabletCommonly known as: DIFLUCANTake one pill now and repeat in three days Gemtesa  75 mg TabGeneric drug: vibegron  ketoconazole  2 % creamCommonly known as: NIZORAL  valACYclovir  500 mg tabletCommonly known as:  VALTREX  vit C-E-cupric-zinc -lutein  226-90-0.8-5 mg Cap Vyzulta  0.024 % ophthalmic solutionGeneric drug: latanoprostene bunod    Where to Get Your Medications  These medications [facility] sent to [facility] [address] [place] - [address]  [address] [place], [place] [address]  Phone: [phone] famotidine  20 mg tablet [facility] [address]E: [phone]    [1] AllergiesAllergen Reactions  Adhesive Rash   TapeTapeTapeTape  Alendronate GI Upset, Nausea And Vomiting and Other (See Comments)  Alendronic Acid Nausea And Vomiting  Atorvastatin Other (See Comments) and Unknown   Elevated ckElevated ckElevated ckOther reaction(s): Other (See Comments)Elevated ckElevated ckElevated ck  Penicillins Other (See Comments)  Phenylalanine Other (See Comments)   Other reaction(s): Other (See Comments)  Tramadol  Hallucinations  Oxycodone Rash

## 2024-10-24 ENCOUNTER — Telehealth: Admit: 2024-10-24 | Payer: PRIVATE HEALTH INSURANCE | Primary: Family

## 2024-10-24 NOTE — Telephone Encounter [36]
 Spoke with patient. Reinforced elevating head of bed 6 in to help with night time GERD. She was in understanding.

## 2024-10-24 NOTE — Telephone Encounter [36]
 Patient called the office wanting to speak with Nat. Patient stated it was regarding her getting a wedge for her bed.

## 2024-11-05 ENCOUNTER — Inpatient Hospital Stay: Admit: 2024-11-05 | Discharge: 2024-11-05 | Payer: PRIVATE HEALTH INSURANCE | Primary: Family

## 2024-11-05 ENCOUNTER — Telehealth: Admit: 2024-11-05 | Payer: PRIVATE HEALTH INSURANCE | Primary: Family

## 2024-11-05 DIAGNOSIS — K219 Gastro-esophageal reflux disease without esophagitis: Secondary | ICD-10-CM

## 2024-11-05 NOTE — Telephone Encounter [36]
 Pt called to say she had her testing done and would like to discuss with Nat.  Please call

## 2024-11-07 ENCOUNTER — Telehealth: Admit: 2024-11-07 | Payer: PRIVATE HEALTH INSURANCE | Primary: Family

## 2024-11-07 ENCOUNTER — Encounter: Admit: 2024-11-07 | Payer: PRIVATE HEALTH INSURANCE | Primary: Family

## 2024-11-07 DIAGNOSIS — R933 Abnormal findings on diagnostic imaging of other parts of digestive tract: Principal | ICD-10-CM

## 2024-11-07 NOTE — Telephone Encounter [36]
 Call to patient regarding results, LVM for call back.

## 2024-11-07 NOTE — Telephone Encounter [36]
 Spoke with patient.  Informed for esophagram results and aspiration seen.  We discussed proceeding with a SLP evaluation and MBS.  She was agreeable to plan.  Orders placed.

## 2024-11-07 NOTE — Telephone Encounter [36]
 Received call back from patient.  We discussed esophagram results.  I discussed SLP evaluation and MBS.  She was in agreement of plan.  Orders placed.

## 2024-11-13 MED ORDER — ACETAMINOPHEN 325 MG CAPSULE
325 | Freq: Four times a day (QID) | ORAL | 1.00 refills | 4.00000 days | Status: AC
Start: 2024-11-13 — End: ?

## 2024-11-13 MED ORDER — ESCITALOPRAM 10 MG TABLET
10 | ORAL | 3.00 refills | 30.00000 days | Status: AC
Start: 2024-11-13 — End: 2024-11-14

## 2024-11-13 MED ORDER — AMMONIUM LACTATE 12 % TOPICAL CREAM
12 | 3.00 refills | 30.00000 days | Status: AC
Start: 2024-11-13 — End: ?

## 2024-11-13 MED ORDER — PRESERVISION AREDS 4,296 MCG-226 MG-90 MG CAPSULE
4296 | ORAL | 1.00 refills | 4.00000 days | Status: AC
Start: 2024-11-13 — End: ?

## 2024-11-13 MED ORDER — VITRON-C 65 MG IRON-125 MG TABLET,DELAYED RELEASE
65 | Freq: Every day | ORAL | 2.00 refills | 90.00000 days | Status: AC
Start: 2024-11-13 — End: ?

## 2024-11-14 ENCOUNTER — Ambulatory Visit: Admit: 2024-11-14 | Payer: MEDICARE | Attending: Obstetrics and Gynecology | Primary: Family

## 2024-11-14 ENCOUNTER — Encounter: Admit: 2024-11-14 | Payer: PRIVATE HEALTH INSURANCE | Attending: Obstetrics and Gynecology | Primary: Family

## 2024-11-14 ENCOUNTER — Inpatient Hospital Stay: Admit: 2024-11-14 | Discharge: 2024-11-15 | Payer: MEDICARE | Primary: Family

## 2024-11-14 VITALS — Ht 63.0 in | Wt 121.0 lb

## 2024-11-14 DIAGNOSIS — H3530 Unspecified macular degeneration: Secondary | ICD-10-CM

## 2024-11-14 DIAGNOSIS — Z01419 Encounter for gynecological examination (general) (routine) without abnormal findings: Secondary | ICD-10-CM

## 2024-11-14 DIAGNOSIS — R87619 Unspecified abnormal cytological findings in specimens from cervix uteri: Secondary | ICD-10-CM

## 2024-11-14 DIAGNOSIS — Z1239 Encounter for other screening for malignant neoplasm of breast: Secondary | ICD-10-CM

## 2024-11-14 DIAGNOSIS — H409 Unspecified glaucoma: Secondary | ICD-10-CM

## 2024-11-14 DIAGNOSIS — M1990 Unspecified osteoarthritis, unspecified site: Secondary | ICD-10-CM

## 2024-11-14 DIAGNOSIS — Z1151 Encounter for screening for human papillomavirus (HPV): Secondary | ICD-10-CM

## 2024-11-14 DIAGNOSIS — Z1272 Encounter for screening for malignant neoplasm of vagina: Secondary | ICD-10-CM

## 2024-11-14 NOTE — Progress Notes [1]
 YEP:Sarah Montes is a 89 y.o.female G3P2010 who presents to this practice for an annual exam. No LMP recorded. Patient has had a hysterectomy.  TWO VAGINAL DELIVERIES AT [facility].  THEN LIVED [place] FOR MANY YEARS AND CAME BACK ABOUT 4 YEARS AGO.  COLD KNIFE CONE IN THE 70S THEN LATER VAGINAL HYSTERECTOMY AND SLING FOR THE BLADDER.  TAKING NEW MEDICATION FOR OVERACTIVE BLADDER AND IT SEEMS TO BE WORKING WELL.  FAMILY HISTORY OF OVARIAN CANCER.Patient HistoryProblem List: has Allergic rhinitis; Anxiety state, unspecified; Benign neoplasm of skin, site unspecified; Carpal tunnel syndrome; Cellulitis of left hand; Actinic keratosis; Chronic neck pain; Dog bite, hand; Dysfunction of eustachian tube; Dyslipidemia; Epiretinal membrane; Glaucoma; H/O vaginal hysterectomy; Headache(784.0); Hearing loss; High risk medication use; History of cataract extraction; History of anxiety; History of cervical dysplasia; History of depression; Hx of herpes genitalis; Lumbago; Hypersomnia; Macrocytosis; Macular degeneration; Major depressive disorder, recurrent episode, unspecified; Mammary duct ectasia of breast; Nocturia; Nonexudative age-related macular degeneration; Nuclear sclerosis; Numbness; Obstructive sleep apnea syndrome; Osteopenia; Other and unspecified hyperlipidemia; PAC (premature atrial contraction); Pain in joint, pelvic region and thigh; Palpitations; Pancytopenia (HC CODE); Primary open angle glaucoma of both eyes, indeterminate stage; Pseudophakia of left eye; Reactive depression (situational); Seborrheic dermatitis; Senile purpura (HC Code); Snoring; Vitamin B 12 deficiency; Vitamin D deficiency; Acute bilateral low back pain without sciatica; Hammertoe of left foot; Spondylolisthesis of lumbar region; Ulcer of right lower leg, with unspecified severity (HC Code)  (HC CODE); Disorder of bone and cartilage; Pelvic floor dysfunction; Urgency incontinence; Abnormal gait; Acute cough; Acute pain of right knee; Acute pharyngitis; Arthritis; CPAP use counseling; Encounter for electrocardiogram; Dermal mycosis; Exudative age-related macular degeneration, bilateral, stage unspecified (HC Code)  (HC CODE); Dog bite, initial encounter; Fall, subsequent encounter; Gastroesophageal reflux disease without esophagitis; Generalized anxiety disorder; Heberden's nodes (with arthropathy); HSV (herpes simplex virus) infection; Laceration of right lower leg, initial encounter; Localized, primary osteoarthritis of hand; Open wound of finger; Pain of right shoulder region; Porokeratosis; Skin rash; Preop examination; Recurrent falls; Sciatica; Sore throat; Stage 3a chronic kidney disease (HC CODE); Stress incontinence in female; Traumatic hematoma; Uses hearing aid; Vaccination complication, initial encounter; Ventricular premature depolarization; and Viral disease on their problem list. Past Surgical History: has a past surgical history that includes Hysterectomy; Cataract extraction; Bladder suspension; Finger surgery (Left); Breast surgery; Bladder surgery; Bunionectomy; Appendectomy; Tonsillectomy and adenoidectomy; Eye surgery; and Adenoidectomy.Past Medical History: has a past medical history of Abnormal cervical Papanicolaou smear, Arthritis, Glaucoma, and Macular degeneration disease.Family History: family history includes Arthritis in her mother; Breast cancer in her maternal aunt and mother; Ovarian cancer in her maternal grandmother.Allergies:is allergic to adhesive, alendronate, alendronic acid, atorvastatin, penicillins, phenylalanine, phenylephrine, tramadol , and oxycodone. Medications: Current Outpatient Medications:   acetaminophen , 4 (four) times daily.  ammonium lactate , APPLY TO TOENAILS TOPICALLY TWICE A DAY  brimonidine , 1 drop, Both Eyes, BID  calcium  carbonate, 600 mg, Oral, Daily  dorzolamide -timolol , 1 drop, Left Eye, BID  famotidine , 20 mg, Oral, Nightly  Gemtesa , 75 mg orally every day  Vitron-C, Take by mouth daily.  Vyzulta , 1 drop, Both Eyes, Nightly  valACYclovir , Oral; Duration: 10 Days  vit C-E-cupric-zinc -lutein , 1 capsule, Oral, BID  PreserVision AREDS, Take by mouth.  cholecalciferol  (vitamin D3), 1,000 Units, Oral, Daily (Patient not taking: Reported on 10/22/2024)  clotrimazole -betamethasone , Apply topically daily. (Patient not taking: Reported on 10/22/2024)  diclofenac  epolamine, 1 patch, Transdermal, Q12H PRN (Patient not taking: Reported on 10/22/2024)  doxycycline  hyclate, 1 capsule, Oral, Q12H (Patient not taking:  Reported on 10/22/2024)  escitalopram  oxalate, TAKE 1 TABLET BY MOUTH EVERY DAY FOR 30 DAYS; Duration: 90  estradiol , USE 1 GRAM VAGINALLY TWICE A WEEK APPLY DIRECTLY TO YOUR URETHRA TWICE WEEKLY BEFORE BEDTIME.  fluconazole , Take one pill now and repeat in three days  ketoconazole , daily as needed. (Patient not taking: Reported on 10/22/2024) Social History: reports that she has never smoked. She does not have any smokeless tobacco history on file. She reports current alcohol use. Menstrual/Sexual History:  reports that she is not currently sexually active.Concerns for Domestic Violence: None OB History Gravida Para Term Preterm AB Living 3 2 2  1   SAB IAB Ectopic Molar Multiple Live Births 1       # Outcome Date GA Lbr Len/2nd Weight Sex Type Anes PTL Lv 3 Term      Vag-Spont    2 SAB          1 Term      Vag-Spont     Obstetric Comments SVD X 2 Objective: BP (P) 136/70  - Ht 5' 3 (1.6 m)  - Wt 54.9 kg  - BMI 21.43 kg/m?  (121 lbs.)Review of Systems Constitutional: Negative for activity change, appetite change and unexpected weight change. Respiratory: Negative.  Negative for shortness of breath and wheezing.  Cardiovascular: Negative for chest pain, palpitations and leg swelling. Gastrointestinal: Negative for abdominal pain, constipation, diarrhea and nausea. Endocrine: Negative for cold intolerance and heat intolerance. Genitourinary: Negative for urinary problems, dyspareunia, pelvic pain, menstrual problems. Musculoskeletal: Negative for pain, joint swelling and myalgias. Skin: Negative for rash and wound. Neurological: Negative for light-headedness and headaches. Hematological: Does not bruise/bleed easily. Psychiatric/Behavioral: The patient denies psychiatric concerns.  Physical Exam:Constitutional: Appears healthy, looks well Mental Status: Alert and cooperative.Breast Bilateral: no discharge from nipple, no breast tenderness, no breast massAbdomen: no abdominal tenderness, no abdominal mass, no herniaGU: External genitalia: normal, no erythrema, no lesionsVagina: Vaginal mucosa normal, no abnormal vaginal discharge. Urethra: normal no masses. Urethral Meatus: no lesions or prolapse. Bladder: no masses or tenderness, no descent.Cervix:  Uterus: Adnexa: no mass, no tendernessRectum: No hemorrhoids or masses.Lymphatic:  no axillary lymphadenopathySkin:  no skin rash or lesions	  Health Maintenance: PAP:  Completed todayHPV Series: N/AChlamydia & GC screening: N/ASTD Blood Work: N/AMammogram: Ordered todayAssessment / Plan: Sarah Montes is a 89 y.o. G40P2010 female normal gyn exam.Patient Education:Specific topics reviewed: Diet, Exercise, Contraception, Pap screening guidelines, Mammogram screeningElectronically Signed by Reyes Pac, MD, November 14, 2024

## 2024-11-15 DIAGNOSIS — Z1151 Encounter for screening for human papillomavirus (HPV): Secondary | ICD-10-CM

## 2024-11-19 ENCOUNTER — Encounter: Admit: 2024-11-19 | Payer: PRIVATE HEALTH INSURANCE | Attending: Obstetrics and Gynecology | Primary: Family

## 2024-11-19 DIAGNOSIS — Z1151 Encounter for screening for human papillomavirus (HPV): Secondary | ICD-10-CM

## 2024-11-19 DIAGNOSIS — Z1272 Encounter for screening for malignant neoplasm of vagina: Secondary | ICD-10-CM

## 2024-11-19 DIAGNOSIS — Z1231 Encounter for screening mammogram for malignant neoplasm of breast: Secondary | ICD-10-CM

## 2024-11-19 DIAGNOSIS — Z01419 Encounter for gynecological examination (general) (routine) without abnormal findings: Principal | ICD-10-CM

## 2024-11-26 ENCOUNTER — Ambulatory Visit: Admit: 2024-11-26 | Payer: PRIVATE HEALTH INSURANCE | Primary: Family

## 2024-12-17 ENCOUNTER — Ambulatory Visit: Admit: 2024-12-17 | Payer: PRIVATE HEALTH INSURANCE | Primary: Family

## 2025-01-21 ENCOUNTER — Encounter: Admit: 2025-01-21 | Payer: PRIVATE HEALTH INSURANCE | Primary: Family
